# Patient Record
Sex: Female | Born: 1935 | Race: Black or African American | Hispanic: No | Marital: Married | Smoking: Former smoker
Health system: Southern US, Community
[De-identification: ages and names within clinical notes are randomized; demographics above are authoritative.]

## PROBLEM LIST (undated history)

## (undated) DIAGNOSIS — M1990 Unspecified osteoarthritis, unspecified site: Secondary | ICD-10-CM

## (undated) DIAGNOSIS — E559 Vitamin D deficiency, unspecified: Secondary | ICD-10-CM

## (undated) DIAGNOSIS — I2510 Atherosclerotic heart disease of native coronary artery without angina pectoris: Secondary | ICD-10-CM

## (undated) DIAGNOSIS — J309 Allergic rhinitis, unspecified: Secondary | ICD-10-CM

## (undated) DIAGNOSIS — R7309 Other abnormal glucose: Secondary | ICD-10-CM

## (undated) DIAGNOSIS — I10 Essential (primary) hypertension: Secondary | ICD-10-CM

## (undated) DIAGNOSIS — K219 Gastro-esophageal reflux disease without esophagitis: Secondary | ICD-10-CM

## (undated) DIAGNOSIS — M949 Disorder of cartilage, unspecified: Secondary | ICD-10-CM

## (undated) DIAGNOSIS — M899 Disorder of bone, unspecified: Secondary | ICD-10-CM

## (undated) DIAGNOSIS — R42 Dizziness and giddiness: Secondary | ICD-10-CM

## (undated) DIAGNOSIS — D869 Sarcoidosis, unspecified: Secondary | ICD-10-CM

## (undated) HISTORY — DX: Allergic rhinitis, unspecified: J30.9

## (undated) HISTORY — DX: Essential (primary) hypertension: I10

## (undated) HISTORY — DX: Vitamin D deficiency, unspecified: E55.9

## (undated) HISTORY — DX: Disorder of cartilage, unspecified: M94.9

## (undated) HISTORY — DX: Other abnormal glucose: R73.09

## (undated) HISTORY — PX: TOTAL KNEE ARTHROPLASTY: SHX125

## (undated) HISTORY — DX: Dizziness and giddiness: R42

## (undated) HISTORY — PX: KNEE ARTHROSCOPY: SUR90

## (undated) HISTORY — PX: APPENDECTOMY: SHX54

## (undated) HISTORY — DX: Sarcoidosis, unspecified: D86.9

## (undated) HISTORY — DX: Unspecified osteoarthritis, unspecified site: M19.90

## (undated) HISTORY — DX: Gastro-esophageal reflux disease without esophagitis: K21.9

## (undated) HISTORY — DX: Disorder of bone, unspecified: M89.9

---

## 1997-12-24 ENCOUNTER — Encounter: Payer: Self-pay | Admitting: Pulmonary Disease

## 1998-08-24 ENCOUNTER — Encounter: Admission: RE | Admit: 1998-08-24 | Discharge: 1998-08-24 | Payer: Self-pay | Admitting: Internal Medicine

## 1999-01-21 ENCOUNTER — Emergency Department (HOSPITAL_COMMUNITY): Admission: EM | Admit: 1999-01-21 | Discharge: 1999-01-21 | Payer: Self-pay | Admitting: Emergency Medicine

## 1999-03-08 ENCOUNTER — Ambulatory Visit (HOSPITAL_COMMUNITY): Admission: RE | Admit: 1999-03-08 | Discharge: 1999-03-08 | Payer: Self-pay | Admitting: Family Medicine

## 1999-03-14 ENCOUNTER — Encounter: Admission: RE | Admit: 1999-03-14 | Discharge: 1999-03-14 | Payer: Self-pay | Admitting: Hematology and Oncology

## 1999-05-31 ENCOUNTER — Encounter: Admission: RE | Admit: 1999-05-31 | Discharge: 1999-05-31 | Payer: Self-pay | Admitting: Internal Medicine

## 1999-05-31 ENCOUNTER — Encounter: Payer: Self-pay | Admitting: Internal Medicine

## 1999-05-31 ENCOUNTER — Ambulatory Visit (HOSPITAL_COMMUNITY): Admission: RE | Admit: 1999-05-31 | Discharge: 1999-05-31 | Payer: Self-pay | Admitting: Internal Medicine

## 1999-12-02 ENCOUNTER — Encounter: Payer: Self-pay | Admitting: Emergency Medicine

## 1999-12-02 ENCOUNTER — Emergency Department (HOSPITAL_COMMUNITY): Admission: EM | Admit: 1999-12-02 | Discharge: 1999-12-02 | Payer: Self-pay | Admitting: Emergency Medicine

## 2001-01-02 ENCOUNTER — Encounter: Payer: Self-pay | Admitting: Internal Medicine

## 2001-01-02 ENCOUNTER — Ambulatory Visit (HOSPITAL_COMMUNITY): Admission: RE | Admit: 2001-01-02 | Discharge: 2001-01-02 | Payer: Self-pay | Admitting: Internal Medicine

## 2001-01-16 ENCOUNTER — Other Ambulatory Visit: Admission: RE | Admit: 2001-01-16 | Discharge: 2001-01-16 | Payer: Self-pay | Admitting: Internal Medicine

## 2002-02-18 ENCOUNTER — Encounter: Payer: Self-pay | Admitting: Internal Medicine

## 2002-02-18 ENCOUNTER — Ambulatory Visit (HOSPITAL_COMMUNITY): Admission: RE | Admit: 2002-02-18 | Discharge: 2002-02-18 | Payer: Self-pay | Admitting: Internal Medicine

## 2002-04-13 ENCOUNTER — Encounter: Admission: RE | Admit: 2002-04-13 | Discharge: 2002-04-13 | Payer: Self-pay | Admitting: Internal Medicine

## 2002-04-13 ENCOUNTER — Encounter: Payer: Self-pay | Admitting: Internal Medicine

## 2003-01-03 ENCOUNTER — Inpatient Hospital Stay (HOSPITAL_COMMUNITY): Admission: AD | Admit: 2003-01-03 | Discharge: 2003-01-08 | Payer: Self-pay | Admitting: Internal Medicine

## 2003-01-03 ENCOUNTER — Encounter: Payer: Self-pay | Admitting: Internal Medicine

## 2003-01-04 ENCOUNTER — Encounter (INDEPENDENT_AMBULATORY_CARE_PROVIDER_SITE_OTHER): Payer: Self-pay | Admitting: *Deleted

## 2003-08-10 ENCOUNTER — Encounter: Payer: Self-pay | Admitting: Internal Medicine

## 2003-08-10 ENCOUNTER — Ambulatory Visit (HOSPITAL_COMMUNITY): Admission: RE | Admit: 2003-08-10 | Discharge: 2003-08-10 | Payer: Self-pay | Admitting: Internal Medicine

## 2004-06-28 ENCOUNTER — Inpatient Hospital Stay (HOSPITAL_COMMUNITY): Admission: RE | Admit: 2004-06-28 | Discharge: 2004-07-04 | Payer: Self-pay | Admitting: Orthopaedic Surgery

## 2005-02-11 ENCOUNTER — Encounter: Admission: RE | Admit: 2005-02-11 | Discharge: 2005-02-11 | Payer: Self-pay | Admitting: Internal Medicine

## 2005-02-21 ENCOUNTER — Encounter: Admission: RE | Admit: 2005-02-21 | Discharge: 2005-02-21 | Payer: Self-pay | Admitting: Internal Medicine

## 2005-03-14 ENCOUNTER — Inpatient Hospital Stay (HOSPITAL_COMMUNITY): Admission: AD | Admit: 2005-03-14 | Discharge: 2005-03-18 | Payer: Self-pay | Admitting: Orthopaedic Surgery

## 2005-03-14 ENCOUNTER — Encounter (INDEPENDENT_AMBULATORY_CARE_PROVIDER_SITE_OTHER): Payer: Self-pay | Admitting: *Deleted

## 2005-03-14 ENCOUNTER — Ambulatory Visit: Payer: Self-pay | Admitting: Physical Medicine & Rehabilitation

## 2005-06-10 ENCOUNTER — Encounter (INDEPENDENT_AMBULATORY_CARE_PROVIDER_SITE_OTHER): Payer: Self-pay | Admitting: Specialist

## 2005-06-10 ENCOUNTER — Ambulatory Visit (HOSPITAL_COMMUNITY): Admission: RE | Admit: 2005-06-10 | Discharge: 2005-06-10 | Payer: Self-pay | Admitting: Gastroenterology

## 2006-07-31 ENCOUNTER — Encounter: Admission: RE | Admit: 2006-07-31 | Discharge: 2006-07-31 | Payer: Self-pay | Admitting: Internal Medicine

## 2007-09-04 ENCOUNTER — Encounter: Admission: RE | Admit: 2007-09-04 | Discharge: 2007-09-04 | Payer: Self-pay | Admitting: Internal Medicine

## 2007-09-08 ENCOUNTER — Encounter: Admission: RE | Admit: 2007-09-08 | Discharge: 2007-09-08 | Payer: Self-pay | Admitting: Internal Medicine

## 2007-09-15 ENCOUNTER — Encounter: Admission: RE | Admit: 2007-09-15 | Discharge: 2007-09-15 | Payer: Self-pay | Admitting: Internal Medicine

## 2007-09-18 ENCOUNTER — Ambulatory Visit: Payer: Self-pay | Admitting: Pulmonary Disease

## 2007-09-18 LAB — CONVERTED CEMR LAB
Angiotensin 1 Converting Enzyme: 129 units/L — ABNORMAL HIGH (ref 9–67)
Basophils Absolute: 0.1 10*3/uL (ref 0.0–0.1)
Chloride: 104 meq/L (ref 96–112)
Eosinophils Absolute: 0.2 10*3/uL (ref 0.0–0.6)
GFR calc non Af Amer: 76 mL/min
HCT: 40.6 % (ref 36.0–46.0)
MCHC: 34.6 g/dL (ref 30.0–36.0)
MCV: 84.4 fL (ref 78.0–100.0)
Neutrophils Relative %: 79.7 % — ABNORMAL HIGH (ref 43.0–77.0)
Potassium: 4.4 meq/L (ref 3.5–5.1)
RBC: 4.81 M/uL (ref 3.87–5.11)
Sodium: 140 meq/L (ref 135–145)

## 2007-10-07 ENCOUNTER — Ambulatory Visit: Payer: Self-pay | Admitting: Pulmonary Disease

## 2007-10-07 DIAGNOSIS — D869 Sarcoidosis, unspecified: Secondary | ICD-10-CM | POA: Insufficient documentation

## 2007-10-07 DIAGNOSIS — J309 Allergic rhinitis, unspecified: Secondary | ICD-10-CM | POA: Insufficient documentation

## 2007-10-07 DIAGNOSIS — I10 Essential (primary) hypertension: Secondary | ICD-10-CM | POA: Insufficient documentation

## 2007-12-18 ENCOUNTER — Telehealth (INDEPENDENT_AMBULATORY_CARE_PROVIDER_SITE_OTHER): Payer: Self-pay | Admitting: *Deleted

## 2007-12-18 ENCOUNTER — Ambulatory Visit: Payer: Self-pay | Admitting: Pulmonary Disease

## 2008-01-22 ENCOUNTER — Ambulatory Visit: Payer: Self-pay | Admitting: Internal Medicine

## 2008-01-22 DIAGNOSIS — K219 Gastro-esophageal reflux disease without esophagitis: Secondary | ICD-10-CM | POA: Insufficient documentation

## 2008-01-27 ENCOUNTER — Ambulatory Visit: Payer: Self-pay | Admitting: Internal Medicine

## 2008-01-27 DIAGNOSIS — R0602 Shortness of breath: Secondary | ICD-10-CM | POA: Insufficient documentation

## 2008-01-31 ENCOUNTER — Encounter: Payer: Self-pay | Admitting: Internal Medicine

## 2008-03-18 ENCOUNTER — Ambulatory Visit: Payer: Self-pay | Admitting: Pulmonary Disease

## 2008-05-25 ENCOUNTER — Ambulatory Visit: Payer: Self-pay | Admitting: Internal Medicine

## 2008-05-25 DIAGNOSIS — R7309 Other abnormal glucose: Secondary | ICD-10-CM

## 2008-06-15 ENCOUNTER — Ambulatory Visit: Payer: Self-pay | Admitting: Pulmonary Disease

## 2008-06-15 ENCOUNTER — Ambulatory Visit: Payer: Self-pay | Admitting: Internal Medicine

## 2008-06-15 LAB — CONVERTED CEMR LAB
BUN: 8 mg/dL (ref 6–23)
Calcium: 9 mg/dL (ref 8.4–10.5)
GFR calc Af Amer: 91 mL/min
GFR calc non Af Amer: 76 mL/min
Potassium: 4 meq/L (ref 3.5–5.1)
Sodium: 141 meq/L (ref 135–145)

## 2008-06-24 ENCOUNTER — Ambulatory Visit: Payer: Self-pay | Admitting: Internal Medicine

## 2008-06-24 DIAGNOSIS — M949 Disorder of cartilage, unspecified: Secondary | ICD-10-CM

## 2008-06-24 DIAGNOSIS — M899 Disorder of bone, unspecified: Secondary | ICD-10-CM | POA: Insufficient documentation

## 2008-08-09 ENCOUNTER — Encounter: Payer: Self-pay | Admitting: Internal Medicine

## 2008-09-09 ENCOUNTER — Encounter: Admission: RE | Admit: 2008-09-09 | Discharge: 2008-09-09 | Payer: Self-pay | Admitting: Internal Medicine

## 2008-09-22 ENCOUNTER — Ambulatory Visit: Payer: Self-pay | Admitting: Internal Medicine

## 2008-09-22 LAB — CONVERTED CEMR LAB: Vit D, 1,25-Dihydroxy: 14 — ABNORMAL LOW (ref 30–89)

## 2008-09-30 ENCOUNTER — Ambulatory Visit: Payer: Self-pay | Admitting: Internal Medicine

## 2008-09-30 DIAGNOSIS — E559 Vitamin D deficiency, unspecified: Secondary | ICD-10-CM | POA: Insufficient documentation

## 2008-09-30 LAB — CONVERTED CEMR LAB
BUN: 13 mg/dL (ref 6–23)
Chloride: 103 meq/L (ref 96–112)
Creatinine, Ser: 0.8 mg/dL (ref 0.4–1.2)
GFR calc non Af Amer: 76 mL/min
Glucose, Bld: 88 mg/dL (ref 70–99)

## 2008-10-03 ENCOUNTER — Encounter: Payer: Self-pay | Admitting: Internal Medicine

## 2008-10-07 ENCOUNTER — Ambulatory Visit: Payer: Self-pay | Admitting: Pulmonary Disease

## 2008-11-04 ENCOUNTER — Ambulatory Visit: Payer: Self-pay | Admitting: Pulmonary Disease

## 2008-11-04 ENCOUNTER — Encounter: Payer: Self-pay | Admitting: Pulmonary Disease

## 2008-11-04 LAB — CONVERTED CEMR LAB
ALT: 12 units/L (ref 0–35)
AST: 14 units/L (ref 0–37)
Albumin: 3.7 g/dL (ref 3.5–5.2)
Alkaline Phosphatase: 40 units/L (ref 39–117)
Bilirubin, Direct: 0.3 mg/dL (ref 0.0–0.3)
Total Protein: 8.1 g/dL (ref 6.0–8.3)

## 2008-12-21 ENCOUNTER — Encounter: Payer: Self-pay | Admitting: Pulmonary Disease

## 2008-12-27 ENCOUNTER — Encounter (HOSPITAL_COMMUNITY): Admission: RE | Admit: 2008-12-27 | Discharge: 2009-03-27 | Payer: Self-pay | Admitting: Orthopedic Surgery

## 2009-01-04 ENCOUNTER — Ambulatory Visit: Payer: Self-pay | Admitting: Internal Medicine

## 2009-01-04 DIAGNOSIS — R269 Unspecified abnormalities of gait and mobility: Secondary | ICD-10-CM

## 2009-01-04 LAB — CONVERTED CEMR LAB
ALT: 10 units/L (ref 0–35)
Alkaline Phosphatase: 39 units/L (ref 39–117)
Basophils Absolute: 0 10*3/uL (ref 0.0–0.1)
Bilirubin, Direct: 0.1 mg/dL (ref 0.0–0.3)
CO2: 31 meq/L (ref 19–32)
Calcium: 9.2 mg/dL (ref 8.4–10.5)
Chloride: 109 meq/L (ref 96–112)
Glucose, Bld: 90 mg/dL (ref 70–99)
Hemoglobin: 13.7 g/dL (ref 12.0–15.0)
Lymphocytes Relative: 28.4 % (ref 12.0–46.0)
MCHC: 34.5 g/dL (ref 30.0–36.0)
Monocytes Relative: 9.1 % (ref 3.0–12.0)
Neutrophils Relative %: 58.4 % (ref 43.0–77.0)
Platelets: 148 10*3/uL — ABNORMAL LOW (ref 150–400)
Potassium: 4.9 meq/L (ref 3.5–5.1)
RDW: 15.1 % — ABNORMAL HIGH (ref 11.5–14.6)
Sodium: 141 meq/L (ref 135–145)
TSH: 1.24 microintl units/mL (ref 0.35–5.50)
Total Bilirubin: 1.5 mg/dL — ABNORMAL HIGH (ref 0.3–1.2)
Total Protein: 7.8 g/dL (ref 6.0–8.3)

## 2009-01-05 ENCOUNTER — Encounter: Payer: Self-pay | Admitting: Internal Medicine

## 2009-01-05 ENCOUNTER — Telehealth: Payer: Self-pay | Admitting: Internal Medicine

## 2009-01-06 ENCOUNTER — Telehealth: Payer: Self-pay | Admitting: Internal Medicine

## 2009-01-23 ENCOUNTER — Ambulatory Visit: Payer: Self-pay | Admitting: Pulmonary Disease

## 2009-03-27 ENCOUNTER — Encounter: Payer: Self-pay | Admitting: Pulmonary Disease

## 2009-05-03 ENCOUNTER — Ambulatory Visit: Payer: Self-pay | Admitting: Internal Medicine

## 2009-08-23 ENCOUNTER — Ambulatory Visit (HOSPITAL_BASED_OUTPATIENT_CLINIC_OR_DEPARTMENT_OTHER): Admission: RE | Admit: 2009-08-23 | Discharge: 2009-08-23 | Payer: Self-pay | Admitting: Internal Medicine

## 2009-08-23 ENCOUNTER — Ambulatory Visit: Payer: Self-pay | Admitting: Radiology

## 2009-08-23 ENCOUNTER — Ambulatory Visit: Payer: Self-pay | Admitting: Internal Medicine

## 2009-08-23 DIAGNOSIS — R05 Cough: Secondary | ICD-10-CM

## 2009-08-23 DIAGNOSIS — R059 Cough, unspecified: Secondary | ICD-10-CM | POA: Insufficient documentation

## 2009-09-04 ENCOUNTER — Telehealth: Payer: Self-pay | Admitting: Internal Medicine

## 2009-09-14 ENCOUNTER — Encounter: Admission: RE | Admit: 2009-09-14 | Discharge: 2009-09-14 | Payer: Self-pay | Admitting: Internal Medicine

## 2009-09-19 ENCOUNTER — Ambulatory Visit: Payer: Self-pay | Admitting: Pulmonary Disease

## 2009-10-23 ENCOUNTER — Ambulatory Visit: Payer: Self-pay | Admitting: Internal Medicine

## 2009-10-23 DIAGNOSIS — R197 Diarrhea, unspecified: Secondary | ICD-10-CM

## 2009-10-23 LAB — CONVERTED CEMR LAB
ALT: 19 units/L (ref 0–35)
AST: 28 units/L (ref 0–37)
Albumin: 4.2 g/dL (ref 3.5–5.2)
BUN: 11 mg/dL (ref 6–23)
Basophils Absolute: 0.1 10*3/uL (ref 0.0–0.1)
Chloride: 100 meq/L (ref 96–112)
Cortisol, Plasma: 18.6 ug/dL
Creatinine, Ser: 0.81 mg/dL (ref 0.40–1.20)
Eosinophils Absolute: 0.4 10*3/uL (ref 0.0–0.7)
Eosinophils Relative: 8 % — ABNORMAL HIGH (ref 0–5)
Glucose, Bld: 96 mg/dL (ref 70–99)
HCT: 44.4 % (ref 36.0–46.0)
Hemoglobin: 14.3 g/dL (ref 12.0–15.0)
Lymphocytes Relative: 16 % (ref 12–46)
Lymphs Abs: 0.8 10*3/uL (ref 0.7–4.0)
MCV: 89.5 fL (ref 78.0–100.0)
Monocytes Absolute: 0.3 10*3/uL (ref 0.1–1.0)
Platelets: 222 10*3/uL (ref 150–400)
Potassium: 3.8 meq/L (ref 3.5–5.3)
RDW: 14.3 % (ref 11.5–15.5)
Total Protein: 7.7 g/dL (ref 6.0–8.3)

## 2009-11-01 ENCOUNTER — Telehealth: Payer: Self-pay | Admitting: Internal Medicine

## 2009-12-21 ENCOUNTER — Ambulatory Visit: Payer: Self-pay | Admitting: Internal Medicine

## 2010-03-23 ENCOUNTER — Ambulatory Visit: Payer: Self-pay | Admitting: Pulmonary Disease

## 2010-08-24 ENCOUNTER — Telehealth: Payer: Self-pay | Admitting: Pulmonary Disease

## 2010-08-24 ENCOUNTER — Ambulatory Visit: Payer: Self-pay | Admitting: Pulmonary Disease

## 2010-08-29 ENCOUNTER — Encounter: Payer: Self-pay | Admitting: Pulmonary Disease

## 2010-10-12 ENCOUNTER — Encounter: Admission: RE | Admit: 2010-10-12 | Discharge: 2010-10-12 | Payer: Self-pay | Admitting: Internal Medicine

## 2010-10-18 ENCOUNTER — Encounter: Payer: Self-pay | Admitting: Internal Medicine

## 2010-10-24 ENCOUNTER — Encounter: Admission: RE | Admit: 2010-10-24 | Discharge: 2010-10-24 | Payer: Self-pay | Admitting: Internal Medicine

## 2011-01-13 LAB — CONVERTED CEMR LAB
Basophils Relative: 0.6 % (ref 0.0–1.0)
Bilirubin, Direct: 0.2 mg/dL (ref 0.0–0.3)
CO2: 34 meq/L — ABNORMAL HIGH (ref 19–32)
Eosinophils Relative: 3.3 % (ref 0.0–5.0)
GFR calc Af Amer: 80 mL/min
Glucose, Bld: 126 mg/dL — ABNORMAL HIGH (ref 70–99)
HCT: 42 % (ref 36.0–46.0)
HDL: 57.7 mg/dL (ref >39.0)
Hemoglobin: 14.3 g/dL (ref 12.0–15.0)
Lymphocytes Relative: 27.8 % (ref 12.0–46.0)
Monocytes Absolute: 0.3 10*3/uL (ref 0.2–0.7)
Neutro Abs: 3.5 10*3/uL (ref 1.4–7.7)
Neutrophils Relative %: 62.1 % (ref 43.0–77.0)
Potassium: 4.4 meq/L (ref 3.5–5.1)
Total Bilirubin: 0.8 mg/dL (ref 0.3–1.2)
Total Protein: 7.7 g/dL (ref 6.0–8.3)
VLDL: 11 mg/dL (ref 0–40)
WBC: 5.5 10*3/uL (ref 4.5–10.5)

## 2011-01-17 NOTE — Progress Notes (Signed)
Summary: letter request -   Phone Note Call from Patient Call back at Home Phone [phone]   Caller: Patient Call For: alva Summary of Call: pt requests a brief letter w/ diagnosis (for the postal service). [place] and this will allow her to get a mailbox closer to her house. mail to pt's home address.  Initial call taken by: Tivis Ringer, CNA,  August 24, 2010 4:01 PM  Follow-up for Phone Call        ATC above number -- after ringing several times received a few short beeps followed by one long beep -- [facility] Gweneth Dimitri RN  August 24, 2010 4:23 PM  Spoke with pt.  She states [place] and her mailbox is across the street and sometimes she feels like she can not get across the street fast enough and is afraid she will be hit by a car.  She states that the postal service worker advised her that if she got a letter from her doctor that they could provide her with a box closer to her home so she does not have to cross the bust road.  Would like this mailed to her once done.  Pls advise thanks Follow-up by: Vernie Murders,  August 27, 2010 4:08 PM  Additional Follow-up for Phone Call Additional follow up Details #1::        OK to provide - 'being treated for sarcoidosis & cannot walk long distances....... Pl help in whatever manner possible.' Additional Follow-up by: Comer Locket. Vassie Loll MD,  August 28, 2010 9:14 AM    Additional Follow-up for Phone Call Additional follow up Details #2::    Letter composed and pending RA's signature. Placed in "look at". Zackery Barefoot CMA  August 29, 2010 2:31 PM

## 2011-01-17 NOTE — Letter (Signed)
Summary: [facility]  [address]   [place], [place] [address]   Phone: [phone]  Fax: [phone]    08/29/2010  [name] [address] [place], [place]  [address]   TO WHOM IT MAY CONCERN,  The above named patient is being treated for sarcoidosis and cannot walk long distances. Please help in whatever manner possible.          Sincerely,    Cyril Mourning, MD

## 2011-01-17 NOTE — Miscellaneous (Signed)
Summary: Orders Update pft charges  Clinical Lists Changes  Orders: Added new Service order of Carbon Monoxide diffusing w/capacity (94720) - Signed Added new Service order of Lung Volumes (94240) - Signed Added new Service order of Spirometry (Pre & Post) (94060) - Signed 

## 2011-01-17 NOTE — Assessment & Plan Note (Signed)
Summary: [name] ///KP   Primary Provider/Referring Provider:  Dondra Spry DO  CC:  Follow-up visit patient states she has good days and bad days, and advised by Dr. Artist Pais to come in to see Dr. Vassie Loll, and .  History of Present Illness: 75 year old with recurrence of sarcoidosis involving her skin and lungs.  Good response to 10 mg of prednisone , her dyspnea  improved, and her skin lesions  resolved. Chest x-ray showed worsening of bilateral parenchymal scarring with chronic fibrotic changes.  ACE level was 129.    10/09 dyspnea persists, desatn on exertion, FVC 52 %, TLC 57%, DLCO 32 % >> decreased wt gain & hyperglycemia with steroids discussed, Spirometry 10/09  >> FVC decreased to 52% from 63% in 4/09, DLCO 32%. Started methotrexate 5 mg q sunday with LFT monitoring >> other side effects discussed  September 19, 2009 10:08 AM  stopped pulm rehab x 2 months due to knee swelling. Breathing better in cold weather. Stopped methotrexate - drug store would not fill, back on prednisone FVC 57%, DLCO 38% - slightly better  March 23, 2010 4:33 PM  Methotrexate was stopped in 11/10 due to diarrhea x 2 weeks, then resumed in 1/11 - 3/11, of agin since Rx ran out. Her skin lesions have returned  per , daughter she is sob on minimal exertion.  desatn today on less than a lap. CXr - stable stg 3 disease.  Allergies: No Known Drug Allergies  Past History:  Past Medical History: Last updated: 12/21/2009 SARCOIDOSIS (ICD-135) HYPERTENSION (ICD-401.9) ALLERGIC RHINITIS (ICD-477.9) GERD  ABNORMAL GLUCOSE   VERTIGO     Social History: Last updated: 12/21/2009 Married - lives with her husband.  One daughter Retired Former Smoker quit 25 yrs ago - smoked 1/2 ppd for 10 years. Alcohol use-no          Review of Systems       The patient complains of dyspnea on exertion and prolonged cough.  The patient denies anorexia, fever, weight loss, weight gain, vision loss, decreased hearing, hoarseness,  chest pain, syncope, peripheral edema, headaches, hemoptysis, abdominal pain, melena, hematochezia, severe indigestion/heartburn, hematuria, muscle weakness, suspicious skin lesions, transient blindness, difficulty walking, depression, unusual weight change, and abnormal bleeding.    Vital Signs:  Patient profile:   75 year old female Height:      67 inches Weight:      221.31 pounds O2 Sat:      93 % on Room air Temp:     98 .3 degrees F oral Pulse rate:   91 / minute BP sitting:   132 / 84  (right arm) Cuff size:   regular  Vitals Entered By: Carver Fila SMA (March 23, 2010 4:18 PM)  O2 Flow:  Room air  Serial Vital Signs/Assessments:  Comments: Ambulatory Pulse Oximetry  Resting; HR_86____    02 Sat___91%RA__  Lap1 (185 feet)   HR_____   02 Sat_____ Lap2 (185 feet)   HR_____   02 Sat_____    Lap3 (185 feet)   HR_____   02 Sat_____  ___Test Completed without Difficulty __X_Test Stopped due to: Pt desat to 82%RA 3/4 of 1st lap. Pt recovered to 92%RA after resting for 6 minutes. Carver Fila SMA  March 23, 2010 5:18 PM     By: Zackery Barefoot CMA   CC: Follow-up visit patient states she has good days and bad days, and advised by Dr. Artist Pais to come in to see Dr. Vassie Loll, Comments Medications  reviewed with patient Verified contact number and pharmacy with patient Carver Fila SMA  March 23, 2010 4:19 PM    Physical Exam  Additional Exam:  Gen. Pleasant, well-nourished, in no distress ENT - no lesions, no post nasal drip Neck: No JVD, no thyromegaly, no carotid bruits Lungs: no use of accessory muscles, no dullness to percussion, bibasal 1/3 rales, no rhonchi  Cardiovascular: Rhythm regular, heart sounds  normal, no murmurs or gallops, no peripheral edema Musculoskeletal: No deformities, no cyanosis or clubbing      CXR  Procedure date:  03/23/2010  Findings:      IMPRESSION:   1.  Chronic lung disease consistent to a sarcoid. 2.  Stable bilateral  hilar  adenopathy 3.  Cardiac enlargement without heart failure.  Impression & Recommendations:  Problem # 1:  SARCOIDOSIS (ICD-135) Improvement in lung function & symptoms have suggested some revrsible sarcoidosis with steroids. However limited by wt gain & hyperglycemia. Hence , better to resume & increase methotrexate 5 mg q week . She will call us if diarrhea returns - not convinced that this was drug related.  Medications Added to Medication List This Visit: 1)  Methotrexate 2.5 Mg Tabs (Methotrexate sodium) .... 2 tabs every sunday  Other Orders: T-2 View CXR ([id number]) Est. Patient Level III (99213) Prescription Created Electronically (G8553) Pulse Oximetry, Ambulatory (94761)  Patient Instructions: 1)  Copy sent to: Dr Yoo 2)  Please schedule a follow-up appointment in 3 months with TP 3)  Get back on methotrexate 4)  A chest x-ray has been recommended.  Your imaging study may require preauthorization.  Prescriptions: METHOTREXATE 2.5 MG TABS (METHOTREXATE SODIUM) 2 tabs every sunday  #24 x 1   Entered and Authorized by:    V.  MD   Signed by:    V.  MD on 03/23/2010   Method used:   Electronically to        [facility] [address] #[id number]* (retail)       [address]       [place], [place]  [address]       Ph: [phone] or [phone]       Fax: [phone]   RxID:   [id number]      Appended Document: ROV ///KP PFTs 08/24/10 >> unchanged lung volumes, DLCO 30% - unchanged

## 2011-01-17 NOTE — Assessment & Plan Note (Signed)
Summary: f/u/hea   Vital Signs:  Patient profile:   75 year old female Weight:      214.25 pounds BMI:     35.78 O2 Sat:      95 % on Room air Temp:     98.2 degrees F oral Pulse rate:   89 / minute Pulse rhythm:   regular Resp:     18 per minute BP sitting:   108 / 80  (right arm) Cuff size:   large  Vitals Entered By: Glendell Docker CMA (December 21, 2009 2:25 PM)  O2 Flow:  Room air  Primary Care Provider:  D. Thomos Lemons DO  CC:  Folllow up .  History of Present Illness: Follow up - no concerns  75 y/o AA female with hx of sarcoidosis for f/u.   Prev she was seen for diarrhea and weakness.   her symptoms resolved after stopping MTX and restarted prednisone "this is the best I have felt in years" no new skin lesions.  Htn - stable.  Lab results reviewed.  Allergies (verified): No Known Drug Allergies  Past History:  Past Medical History: SARCOIDOSIS (ICD-135) HYPERTENSION (ICD-401.9) ALLERGIC RHINITIS (ICD-477.9) GERD  ABNORMAL GLUCOSE   VERTIGO     Past Surgical History: Appendectomy  Knee Arthroscopy       Social History: Married - lives with her husband.  One daughter Retired Former Smoker quit 25 yrs ago - smoked 1/2 ppd for 10 years. Alcohol use-no          Physical Exam  General:  alert and overweight-appearing.  pleasant Lungs:  normal respiratory effort.  bilateral crackles Heart:  normal rate, regular rhythm, and no gallop.   Extremities:  No lower extremity edema    Impression & Recommendations:  Problem # 1:  SARCOIDOSIS (ICD-135) Assessment Improved She is feeling much better.  Resp status stable.  no new skin lesions.   I would like to taper off prednisone and re try MTX.  Pred 5 mg once daily x 1 month.   Arrange f/u with Dr. Vassie Loll in 2 months.  Problem # 2:  HYPERTENSION (ICD-401.9) well controlled.  Maintain current medication regimen.  The following medications were removed from the medication list:    Lasix 20 Mg Tabs  (Furosemide) .Marland Kitchen... 1/2 by mouth once daily as needed Her updated medication list for this problem includes:    Benicar Hct 40-25 Mg Tabs (Olmesartan medoxomil-hctz) ..... One by mouth qd  BP today: 108/80 Prior BP: 114/80 (10/23/2009)  Labs Reviewed: K+: 3.8 (10/23/2009) Creat: : 0.81 (10/23/2009)   Chol: 186 (01/27/2008)   HDL: 57.7 (01/27/2008)   LDL: 118 (01/27/2008)   TG: 54 (01/27/2008)  Problem # 3:  DIARRHEA (ICD-787.91) Assessment: Improved resolved.  AM cortisol was normal.  Complete Medication List: 1)  Benicar Hct 40-25 Mg Tabs (Olmesartan medoxomil-hctz) .... One by mouth qd 2)  Centrum Silver Tabs (Multiple vitamins-minerals) .... Take 1 tablet by mouth once a day 3)  Vitamin D 2000 Unit Tabs (Cholecalciferol) .... Take 1 tablet by mouth once a day 4)  Prednisone 5 Mg Tabs (Prednisone) .... One by mouth once daily 5)  Methotrexate 2.5 Mg Tabs (Methotrexate sodium) .... 2 tabs on sunday  Patient Instructions: 1)  Please schedule a follow-up appointment in 2 months with Dr. Vassie Loll Prescriptions: METHOTREXATE 2.5 MG TABS (METHOTREXATE SODIUM) 2 tabs on Sunday  #8 x 2   Entered and Authorized by:   D. Thomos Lemons DO   Signed by:  Dondra Spry DO on 12/21/2009   Method used:   Electronically to        [facility]. [address] [phone]* (retail)       [address]       [place], [place]  [address]       Ph: [phone] or [phone]       Fax: [phone]   RxID:   [id number] PREDNISONE 5 MG TABS (PREDNISONE) one by mouth once daily  #30 x 0   Entered and Authorized by:   D. Thomos Lemons DO   Signed by:   D. Thomos Lemons DO on 12/21/2009   Method used:   Electronically to        [facility]. [address] [phone]* (retail)       [address]       [place], [place]  [address]       Ph: [phone] or [phone]       Fax: [phone]   RxID:   [id number]   Current Allergies (reviewed today): No known allergies

## 2011-02-13 ENCOUNTER — Encounter: Payer: Self-pay | Admitting: Internal Medicine

## 2011-02-13 ENCOUNTER — Other Ambulatory Visit: Payer: Self-pay | Admitting: Internal Medicine

## 2011-02-13 ENCOUNTER — Ambulatory Visit (INDEPENDENT_AMBULATORY_CARE_PROVIDER_SITE_OTHER): Payer: Medicare Other | Admitting: Internal Medicine

## 2011-02-13 ENCOUNTER — Ambulatory Visit (HOSPITAL_BASED_OUTPATIENT_CLINIC_OR_DEPARTMENT_OTHER)
Admission: RE | Admit: 2011-02-13 | Discharge: 2011-02-13 | Disposition: A | Discharge status: Home / Self Care | Payer: Medicare Other | Source: Ambulatory Visit | Attending: Internal Medicine | Admitting: Internal Medicine

## 2011-02-13 DIAGNOSIS — R05 Cough: Secondary | ICD-10-CM

## 2011-02-13 DIAGNOSIS — D869 Sarcoidosis, unspecified: Secondary | ICD-10-CM

## 2011-02-13 DIAGNOSIS — R053 Chronic cough: Secondary | ICD-10-CM

## 2011-02-13 DIAGNOSIS — I10 Essential (primary) hypertension: Secondary | ICD-10-CM

## 2011-02-13 DIAGNOSIS — R059 Cough, unspecified: Secondary | ICD-10-CM

## 2011-02-13 DIAGNOSIS — R0602 Shortness of breath: Secondary | ICD-10-CM | POA: Insufficient documentation

## 2011-02-13 LAB — CONVERTED CEMR LAB
ALT: 9 units/L (ref 0–35)
AST: 16 units/L (ref 0–37)
Albumin: 4.4 g/dL (ref 3.5–5.2)
Alkaline Phosphatase: 48 units/L (ref 39–117)
BUN: 15 mg/dL (ref 6–23)
Bilirubin, Direct: 0.2 mg/dL (ref 0.0–0.3)
CO2: 29 meq/L (ref 19–32)
Calcium: 9.8 mg/dL (ref 8.4–10.5)
Chloride: 100 meq/L (ref 96–112)
Cholesterol: 172 mg/dL (ref 0–200)
Creatinine, Ser: 0.74 mg/dL (ref 0.40–1.20)
Glucose, Bld: 84 mg/dL (ref 70–99)
HCT: 44.3 % (ref 36.0–46.0)
HDL: 51 mg/dL (ref >39)
Hemoglobin: 14.4 g/dL (ref 12.0–15.0)
Hgb A1c MFr Bld: 6.1 % — ABNORMAL HIGH (ref <5.7)
Indirect Bilirubin: 0.9 mg/dL (ref 0.0–0.9)
LDL Cholesterol: 106 mg/dL — ABNORMAL HIGH (ref 0–99)
MCHC: 32.5 g/dL (ref 30.0–36.0)
MCV: 89.5 fL (ref 78.0–100.0)
Platelets: 121 10*3/uL — ABNORMAL LOW (ref 150–400)
Potassium: 4.1 meq/L (ref 3.5–5.3)
RBC: 4.95 M/uL (ref 3.87–5.11)
RDW: 14.5 % (ref 11.5–15.5)
Sodium: 140 meq/L (ref 135–145)
TSH: 1.323 microintl units/mL (ref 0.350–4.500)
Total Bilirubin: 1.1 mg/dL (ref 0.3–1.2)
Total CHOL/HDL Ratio: 3.4
Total Protein: 8.4 g/dL — ABNORMAL HIGH (ref 6.0–8.3)
Triglycerides: 73 mg/dL (ref <150)
VLDL: 15 mg/dL (ref 0–40)
WBC: 4.6 10*3/uL (ref 4.0–10.5)

## 2011-02-14 ENCOUNTER — Encounter: Payer: Self-pay | Admitting: Internal Medicine

## 2011-02-14 ENCOUNTER — Telehealth: Payer: Self-pay | Admitting: Internal Medicine

## 2011-02-21 NOTE — Letter (Signed)
   [place] [facility] [address] [place], [place]  [address]  [place] Phone: [phone]      February 14, 2011   [facility] [place] [address] [facility] [place], [place] [address]  RE:  LAB RESULTS  Dear  Ms. Stamps,  The following is an interpretation of your most recent lab tests.  Please take note of any instructions provided or changes to medications that have resulted from your lab work.  ELECTROLYTES:  Good - no changes needed  KIDNEY FUNCTION TESTS:  Good - no changes needed  LIVER FUNCTION TESTS:  Stable - no changes needed  LIPID PANEL:  Good - no changes needed Triglyceride: 73   Cholesterol: 172   LDL: 106   HDL: 51   Chol/HDL%:  3.4 Ratio  THYROID STUDIES:  Thyroid studies normal TSH: 1.323     DIABETIC STUDIES:  Good - no changes needed Blood Glucose: 84   HgbA1C: 6.1     CBC:  Good - no changes needed       Sincerely Yours,    Dr. Thomos Lemons  Appended Document:  mailed

## 2011-02-26 NOTE — Progress Notes (Signed)
Summary: CXR results  Phone Note Outgoing Call   Summary of Call: call pt - chest xray is stable.  no acute changes.  I will send letter re:  lab results  Initial call taken by: D. Thomos Lemons DO,  February 14, 2011 4:42 PM  Follow-up for Phone Call        call placed to patient at [phone], no answer, no voice mail. Glendell Docker CMA  February 15, 2011 11:39 AM   call place to patient a [phone], no answer, no voice mail. Glendell Docker CMA  February 15, 2011 3:47 PM   Additional Follow-up for Phone Call Additional follow up Details #1::        Attempted to contact pt; no answer, no voicemail. Unable to leave message. Nicki Guadalajara Fergerson CMA Duncan Dull)  February 18, 2011 3:51 PM

## 2011-03-05 NOTE — Assessment & Plan Note (Signed)
Summary: 1 year follow up/flu shot/ss   Vital Signs:  Patient profile:   75 year old female Height:      67 inches Weight:      219.50 pounds BMI:     34.50 O2 Sat:      93 % Temp:     97.5 degrees F oral Pulse rate:   72 / minute Resp:     18 per minute BP sitting:   140 / 90  (left arm) Cuff size:   large  Vitals Entered By: Glendell Docker CMA (February 13, 2011 10:21 AM) CC: follow-up visit   Primary Care Provider:  Dondra Spry DO  CC:  follow-up visit.  History of Present Illness:   Hypertension Follow-Up      This is a 75 year old woman who presents for Hypertension follow-up.  The patient denies lightheadedness and headaches.  The patient denies the following associated symptoms: chest pain.  The patient reports that dietary compliance has been fair.    Sarcoidosis - never followed up with Dr. Vassie Loll stopped MTX.  pt felt it did not help much pt notes chronic cough.    Preventive Screening-Counseling & Management  Alcohol-Tobacco     Smoking Status: quit  Allergies (verified): No Known Drug Allergies  Past History:  Past Medical History: SARCOIDOSIS (ICD-135) HYPERTENSION (ICD-401.9) ALLERGIC RHINITIS (ICD-477.9) GERD  ABNORMAL GLUCOSE   VERTIGO      Past Surgical History: Appendectomy  Knee Arthroscopy        Family History: Sister with asthma Mother has history of bone cancer Sister with DM Type II     husband had health problems - had AAA repair    Social History: Married - lives with her husband.  One daughter Retired Former Smoker quit 25 yrs ago - smoked 1/2 ppd for 10 years. Alcohol use-no         Smoking Status:  quit  Physical Exam  General:  alert, well-developed, and well-nourished.   Mouth:  pharynx pink and moist.   Lungs:  normal respiratory effort.  bilateral crackles Heart:  normal rate, regular rhythm, and no gallop.     Impression & Recommendations:  Problem # 1:  HYPERTENSION (ICD-401.9) Assessment  Unchanged  Her updated medication list for this problem includes:    Benicar Hct 40-12.5 Mg Tabs (Olmesartan medoxomil-hctz) ..... One by mouth once daily  Orders: T-Basic Metabolic Panel [id number]) T-Hepatic Function [id number]) T-Lipid Profile [id number]) T-TSH ([id number])  BP today: 140/90 Prior BP: 132/84 (03/23/2010)  Labs Reviewed: K+: 3.8 (10/23/2009) Creat: : 0.81 (10/23/2009)   Chol: 186 (01/27/2008)   HDL: 57.7 (01/27/2008)   LDL: 118 (01/27/2008)   TG: 54 (01/27/2008)  Problem # 2:  SARCOIDOSIS (ICD-135) Pt notes increase in cough and dyspnea restart prednisone pt feels MTX does not help arrange pulm follow up  Orders: T-CBC No Diff ([id number]) Pulmonary Referral (Pulmonary)  Complete Medication List: 1)  Benicar Hct 40-12.5 Mg Tabs (Olmesartan medoxomil-hctz) .... One by mouth once daily 2)  Centrum Silver Tabs (Multiple vitamins-minerals) .... Take 1 tablet by mouth once a day 3)  Vitamin D 2000 Unit Tabs (Cholecalciferol) .... Take 1 tablet by mouth once a day 4)  Prednisone 10 Mg Tabs (Prednisone) .... One by mouth once daily  Other Orders: T- Hemoglobin A1C ([id number]) CXR- 2view (CXR)  Patient Instructions: 1)  Please schedule a follow-up appointment in 6 months. Prescriptions: PREDNISONE 10 MG TABS (PREDNISONE) one by mouth once daily  #30 x 2  Entered and Authorized by:   D. Thomos Lemons DO   Signed by:   D. Thomos Lemons DO on 02/13/2011   Method used:   Electronically to        [facility]. [address] [phone]* (retail)       [address]       [place], [place]  [address]       Ph: [phone] or [phone]       Fax: [phone]   RxID:   [id number] BENICAR HCT 40-12.5 MG TABS (OLMESARTAN MEDOXOMIL-HCTZ) one by mouth once daily  #90 x 1   Entered and Authorized by:   D. Thomos Lemons DO   Signed by:   D. Thomos Lemons DO on 02/13/2011   Method used:   Electronically to        [facility]. [address] [phone]* (retail)        [address]       [place], [place]  [address]       Ph: [phone] or [phone]       Fax: [phone]   RxID:   [id number]    Orders Added: 1)  T-Basic Metabolic Panel [id number] 2)  T-Hepatic Function [80076-22960] 3)  T-Lipid Profile [80061-22930] 4)  T- Hemoglobin A1C [83036-23375] 5)  T-TSH [03500-93818] 6)  T-CBC No Diff [85027-10000] 7)  CXR- 2view [CXR] 8)  Pulmonary Referral [Pulmonary] 9)  Est. Patient Level III [29937]    Current Allergies (reviewed today): No known allergies

## 2011-03-19 ENCOUNTER — Institutional Professional Consult (permissible substitution): Payer: Medicare Other | Admitting: Pulmonary Disease

## 2011-04-10 ENCOUNTER — Encounter: Payer: Self-pay | Admitting: Pulmonary Disease

## 2011-04-12 ENCOUNTER — Ambulatory Visit (INDEPENDENT_AMBULATORY_CARE_PROVIDER_SITE_OTHER): Payer: Medicare Other | Admitting: Pulmonary Disease

## 2011-04-12 ENCOUNTER — Encounter: Payer: Self-pay | Admitting: Pulmonary Disease

## 2011-04-12 VITALS — BP 132/80 | HR 90 | Temp 98.4°F | Ht 67.0 in | Wt 226.4 lb

## 2011-04-12 DIAGNOSIS — D869 Sarcoidosis, unspecified: Secondary | ICD-10-CM

## 2011-04-12 MED ORDER — METHOTREXATE 2.5 MG PO TABS: 10.0000 mg | ORAL_TABLET | ORAL | Status: AC

## 2011-04-12 NOTE — Progress Notes (Signed)
SATURATION QUALIFICATIONS:  Patient Saturations on Room Air at Rest = 91%  Patient Saturations on Room Air while Ambulating = 81%  Patient Saturations on 2 Liters of oxygen while Ambulating = 95%

## 2011-04-12 NOTE — Patient Instructions (Signed)
We will set you up with oxygen  Use this when you walk & during sleep. Start methotrexate 4 tabs every Saturday - DO NOT stop this until we say so. Take 1/2 tab of prednisone until done

## 2011-04-12 NOTE — Progress Notes (Signed)
  Subjective:    Patient ID: [name], female    DOB: Sep 08, 1936, 75 y.o.   MRN: [id number]  HPI 75 year old with  sarcoidosis involving her skin and lungs. Good response to 10 mg of prednisone , her dyspnea improved, and her skin lesions resolved.  Chest x-ray showed worsening of bilateral parenchymal scarring with chronic fibrotic changes. ACE level was 129.  10/09 dyspnea persists, desatn on exertion, FVC 52 %, TLC 57%, DLCO 32 % >> decreased   Spirometry 10/09 >> FVC decreased to 52% from 63% in 4/09, DLCO 32%. Started methotrexate 5 mg q sunday with LFT monitoring  September 19, 2009  stopped pulm rehab x 2 months due to knee swelling. Breathing better in cold weather. Stopped methotrexate - drug store would not fill, back on prednisone  FVC 57%, DLCO 38% - slightly better   March 23, 2010 Methotrexate was stopped in 11/10 due to diarrhea x 2 weeks, then resumed in 1/11 - 3/11, of agin since Rx ran out. Her skin lesions have returned per , daughter she is sob on minimal exertion. desatn today on less than a lap. CXr - stable stg 3 disease.    04/12/2011 Once again she stopped her methotrexate since refills ran out - never called [place]. Dr Artist Pais has  her on prednisone x 2 months. Continues to desatn on exertion. Reviewed CRX  02/13/11 - interstitial fibrosis persists, BMET , Ca & LFTs ok       Review of SystemsPt denies any significant  nasal congestion or excess secretions, fever, chills, sweats, unintended wt loss, pleuritic or exertional cp, orthopnea pnd or leg swelling.  Pt also denies any obvious fluctuation in symptoms with weather or environmental change or other alleviating or aggravating factors.    Pt denies any increase in rescue therapy over baseline, denies waking up needing it or having early am exacerbations or coughing/wheezing/ or dyspnea       Objective:   Physical Exam Gen. Pleasant, well-nourished, in no distress, normal affect ENT - no lesions, no post nasal  drip Neck: No JVD, no thyromegaly, no carotid bruits Lungs: no use of accessory muscles, no dullness to percussion, RLL rales, no rhonchi  Cardiovascular: Rhythm regular, heart sounds  normal, no murmurs or gallops, no peripheral edema Abdomen: soft and non-tender, no hepatosplenomegaly, BS normal. Musculoskeletal: No deformities, no cyanosis or clubbing SKin - lesion over rt chest wall, no extremity lesions         Assessment & Plan:

## 2011-04-12 NOTE — Assessment & Plan Note (Addendum)
Start back on methotrexate 10 mg q week Taper steroids to off - I feel side effects will be worse if used long term. Will need LFT monitoring in 3 mnths Start O2 to prevent long term effects of hypoxia - she is agreeable now I hope to see some general improvement in her fatigue & dyspnea

## 2011-04-30 NOTE — Assessment & Plan Note (Signed)
[facility]                             PULMONARY OFFICE NOTE   NAME:Markoff, SOHA THORUP                   MRN:          [id number]  DATE:10/07/2007                            DOB:          1936-08-29    Ms. Murtaugh is a pleasant 75 year old with recurrence of sarcoidosis  involving her skin and lungs.  On 10 mg of prednisone for the last two  weeks, her dyspnea has improved, and her skin lesions are resolving.  Chest x-ray showed worsening of bilateral parenchymal scarring with  chronic fibrotic changes.  ACE level was 129.  PFTs showed restriction  with an FVC of 57%, TLC of 51%, and DLC of 41%.  This is actually  slightly improved when compared to 2003.   CURRENT MEDICATIONS:  1. Prednisone 10 mg daily.  2. Advair 250/50 1 puff b.i.d.  3. Nexium 40 mg daily.  4. Avalide 300/12.5 mg 1/2 tablet daily.  5. Centrum Silver.   RECOMMENDATIONS:  1. She will continue 10 mg of prednisone for at least three months.      She will return for a followup with the chest x-ray then.  If she      is symptomatically improved, we will taper prednisone to off.  2. Pneumovax will be administered today.     Oretha Milch, MD  Electronically Signed    RVA/MedQ  DD: 10/07/2007  DT: 10/08/2007  Job #: [id number]   cc:   Olene Craven, M.D.

## 2011-04-30 NOTE — Assessment & Plan Note (Signed)
[facility]                             PULMONARY OFFICE NOTE   NAME:MAXWELLAdabella, Stanis                   MRN:          [id number]  DATE:09/18/2007                            DOB:          10-Nov-1936    REFERRING PHYSICIAN:  Olene Craven, M.D.   HISTORY OF PRESENT ILLNESS:  Ms. Wojtaszek is a pleasant 75 year old who  presented first in December 1998 with dyspnea and bilateral hilar  adenopathy without much evidence of parenchymal lung disease.  Mediastinoscopy, lymph node biopsy by Dr. Cornelius Moras, confirmed non-  necrotizing granulomas consistent with sarcoidosis.  Spirometry showed  an FEV1 of 58% and FVC of 54% with no evidence of airway obstruction  (FEV1/FVC was 78).  She was unable to perform maneuvers for lung  volumes.  Diffusion capacity was decreased at 30% but this did correct  for alveolar volume.  She was given prednisone for a short period and  then this was discontinued.  Her symptoms started worsening a few weeks ago.  She was dyspneic to the  point of even after walking a few steps.  She noticed purple red spots  on her legs which progressed to patches with hyperpigmentation.  She was  given a prednisone dose pack with marked improvement.  A chest x-ray  showed slight worsening of bilateral parenchymal scarring with some  chronic fibrotic changes.  She saw a dermatologist and was given local  ointment with improvement in her skin lesions.  She now reports a dry cough and improvement in her dyspnea on 10 of  prednisone.  She denies any blurring of vision or palpitations.   PAST MEDICAL HISTORY:  1. Hypertension.  2. Sarcoidosis as described above.  3. Allergic rhinitis.   PAST SURGICAL HISTORY:  1. Appendectomy 4 years ago.  2. Knee surgery in March 2007.   ALLERGIES:  None.   CURRENT MEDICATIONS:  1. Avalide 300/12.5 mg a half tablet daily.  2. Nexium 40 mg daily.  3. Prednisone 10 mg daily.  4. Centrum Silver daily.  5.  Advair 250/50 b.i.d. is listed but she is not taking this.   SOCIAL HISTORY:  Smoked about a half pack per day for 10 years, quit 25  years ago.  She is married and lives with her husband.  She used to work  as a Advertising copywriter.   FAMILY HISTORY:  Asthma in her sister.  Bone cancer in her mother.   REVIEW OF SYSTEMS:  Reports acid heartburn and occasional loss of  appetite with an un-quantified weight loss over the past few years.  Denies fevers, chills, or night sweats.   PHYSICAL EXAMINATION:  VITAL SIGNS:  Weight 191.8 pounds, temperature  97.8, blood pressure 140/96, heart rate 62, oxygen saturation 93% on  room air.  HEENT:  Class III airway.  No thrush.  NECK:  Supple.  No JVD.  No lymphadenopathy.  CV:  S1 S2 normal.  CHEST:  Bilateral fine crackles in the axillae.  No rhonchi.  ABDOMEN:  Soft, nontender.  NEUROLOGIC:  Nonfocal.  EXTREMITIES:  Hyper-pigmentation about lower extremities.   IMPRESSION:  1. Sarcoidosis involving  the skin and lungs with recurrence.  2. Restriction in pulmonary function tests related to above.   RECOMMENDATIONS:  1. She seems to have improved after the dose pack and is currently      stable on 10 mg of prednisone.  We will continue the current dose.  2. Spirometry and DLCO will be obtained to quantitate lung function.      If this is worse than the one from 2003, we will increase her dose      of steroids . Back then, she could not perform the lung volume      maneuvers and I do not think this is necessary to treat sarcoidosis      anyway.  An ACE level and serum calcium level will be estimated.  3. I would keep her on this dose of prednisone for at least 3 months      as suppression therapy before tapering her off.  4. Flu shot and Pneumovax will be administered when she is      symptomatically improved.     Oretha Milch, MD  Electronically Signed    RVA/MedQ  DD: 09/18/2007  DT: 09/18/2007  Job #: [id number]   cc:   Olene Craven,  M.D.

## 2011-05-03 NOTE — Discharge Summary (Signed)
[name], [name]            ACCOUNT NO.:  [id number]   MEDICAL RECORD NO.:  [id number]          PATIENT TYPE:  INP   LOCATION:  5008                         FACILITY:  [facility]   PHYSICIAN:  Claude Manges. Whitfield, M.D.DATE OF BIRTH:  07-02-1936   DATE OF ADMISSION:  03/14/2005  DATE OF DISCHARGE:  03/18/2005                                 DISCHARGE SUMMARY   ADMISSION DIAGNOSES:  1.  Painful right total knee arthroplasty with decreased range of motion.  2.  Hypertension.  3.  History of recent bronchitis.   DISCHARGE DIAGNOSES:  1.  Painful right total knee status post synovectomy and polyethylene      exchange with irrigation and debridement.  2.  Hypokalemia.  3.  Hypertension.  4.  Recent bronchitis.   SURGICAL PROCEDURE:  On March 14, 2005 [name] underwent a debridement  of her right total knee with a polyethelene exchange.  This was done open by  Dr. Claude Manges. Whitfield and [employer], P.[employer].-C.  She had an LCS complete  __________ standard 10-mm thickness and an LCS complete 3-peg, middle back  patella replacement size standard.   COMPLICATIONS:  None.   CONSULTANTS:  1.  Pharmacy consult for Coumadin therapy March 14, 2005.  2.  Rehab medicine consult March 15, 2005 in addition to a physical therapy      consult.  3.  Occupational therapy consult March 16, 2005.   HISTORY OF PRESENT ILLNESS:  This 75 year old black female patient presented  to Dr. Cleophas Dunker with a history of a right total knee done by him in July  2005.  She had been doing well until she fell 4 weeks ago; and, since that  time, she has had difficulty with range of motion and pain in the knee.  Because of that she is being taken to the OR for an open synovectomy,  manipulation, and possible polyethylene exchange.   HOSPITAL COURSE:  [name] tolerated her surgical procedure well without  immediate postoperative complications.  She was transferred to 5000.  On  postoperative day 1 she was  afebrile, vitals stable.  Right leg was  neurovascularly intact.  Incision benign.  Potassium was low at 3.1 and she  was supplemented with K-Dur.   She continued to make good, but slow progress over the next several days.  Her potassium remained low at 3.3 so supplements were increased.  She was  continued on therapy.  Today, March 18, 2005, she is doing very well.  She is  having very little pain and it is controlled with Vicodin.  Potassium has  improved to 4.1.  Right knee incision is well approximated with staples and  minimal drainage.  She is ambulating well and moving around the room.  Independently.  She feels that she is ready for discharge home and will be  discontinued home later today.   DISCHARGE INSTRUCTIONS:  Diet:  She can resume her regular  prehospitalization diet.   MEDICATIONS:  She may resume her prehospitalization meds; these include:  1.  Avalide 150/12.5 mg 1 tablet p.o. q.h.s.  2.  Vicodin 5/500 mg 1-2  tablets p.o. q.4-6h. p.r.n. for pain.  3.  Centrum Silver 1 tablet p.o. q.a.m.   ADDITIONAL MEDICATIONS:  1.  Coumadin 5 mg 1 tablet p.o. every 6 p.m. each day with pharmacy to      adjust the dose as needed.  She is given 40 with no refill.  2.  She is given an additional 45 Vicodin.   ACTIVITY:  She can be out of bed weightbearing as tolerated on the right leg  with the use of the walker.  She is to have a home CPM 0-100 degrees 6-8  hours a day, arrange home health PT per [facility] or [facility] --  please see the blue total knee discharge sheet for further activity  instructions.   WOUND CARE:  Please keep the right knee incision clean and dry.  Make sure  no drainage from the wound for 2 days.  Please see the blue total knee  discharge sheet for further wound care instructions.   FOLLOWUP:  She needs to follow up with Dr. Cleophas Dunker in our office in  approximately 10 days and needs to call [phone] to set up that appointment.   LABORATORY DATA:   Chest x-ray done on March 11, 2005 showed severe  interstitial fibrotic changes with minimal interval worsening of  interstitial accentuation.  On February 11, 2005 she had had a chest x-ray  that showed prominent interstitial markings with some findings due to  bronchitis, but pneumonia or possible viral pneumonia could be  consideration. She has had stable cardiomegaly which was mild and they could  not exclude hilar mediastinal adenopathy.  CT scan was recommended in  February with IV contrast media.   On March 31 hemoglobin 10.2, hematocrit 28.8.  On the 2nd hemoglobin 9.4,  hematocrit 28, white count 4.8 and platelets 206.  On April 1 PT 17.1, INR  1.6 and on April 3 PT 17.6 and INR 1.7.   On March 27 the potassium was 3.4.  It dropped to a low of 3.1 on March 31,  and now on April 3 it is now 4.1.  Calcium was 8.3 on the 31st with albumin  3.3 on the 27th.  Glucoses ranged from 93-111.   Urinalysis on March 27 showed 30 mg/dL of protein, a few epithelials, trace  leukocyte esterase, 0-2 white cells, many bacteria, yeast noted.  Other  laboratory studies were within normal limits.      KED/MEDQ  D:  03/18/2005  T:  03/18/2005  Job:  [id number]   cc:   Olene Craven, M.D.  [address]  [address]  [place]  [place] [address]  Fax: [phone]

## 2011-05-03 NOTE — Discharge Summary (Signed)
NAME:  [name], [name]                      ACCOUNT NO.:  [id number]   MEDICAL RECORD NO.:  [id number]                   Hayes TYPE:  INP   LOCATION:  5038                                 FACILITY:  [facility]   PHYSICIAN:  Claude Manges. Cleophas Dunker, M.D.            DATE OF BIRTH:  1935-12-28   DATE OF ADMISSION:  06/28/2004  DATE OF DISCHARGE:  07/04/2004                                 DISCHARGE SUMMARY   ADMISSION DIAGNOSES:  1.  End-stage osteoarthritis right knee bilateral knees right worse than      left.  2.  Hypertension.  3.  History of appendectomy and hiatal hernia repair.   DISCHARGE DIAGNOSES:  1.  End-stage osteoarthritis bilateral knees, status post right total knee      arthroplasty.  2.  Acute blood loss anemia secondary to surgery.  3.  Hypotension now resolved.  4.  Mild hyponatremia, now resolved.  5.  Mild hypokalemia.  6.  Hypertension.  7.  History of appendectomy and hiatal hernia repair.   PAST SURGICAL HISTORY:  On June 28, 2004, [name] underwent Desiree right  total knee arthroplasty by Dr. Claude Manges. Cleophas Dunker, assisted by Jamelle Rushing, P.Desiree.C.  [name] had Desiree DePuy MBT Keel tibial tray cemented size 2.5 with an  ALS complete primary femoral component cemented size standard right.  An LCS  complete metal back patella cemented size standard and an LCS complete RPN  insert size standard 10 mm thickness.   COMPLICATIONS:  None.   CONSULTATIONS:  1.  Pharmacy consult for Coumadin therapy June 28, 2004.  2.  Physical therapy, occupational therapy, and rehabilitation medicine      consult June 29, 2004.  3.  [facility] consult July 01, 2004.  4.  Case management consult July 03, 2004.   HISTORY OF PRESENT ILLNESS:  [name] presented to  Dr. Claude Manges. Whitfield with one to two year history of right knee pain.  The  pain is constant sharp sensation without radiation.  It is worse with  ambulation and rising from Desiree sitting to Desiree  standing position.  Tylenol does  help her pain Desiree bit but [name] has failed other conservative measures.  X-rays  do show severe osteoarthritis in both knees.  Because of her failure of  conservative treatment, [name] is presenting with right knee replacement.   HOSPITAL COURSE:  [name] tolerated her surgical procedure well without  immediate postoperative complications.  [name] was subsequently transferred to  5000.   On postoperative day one, [name] was afebrile.  Vital signs stable.  Hemoglobin  10.2, hematocrit 29.8.  [name] was started on therapy per protocol.   On postoperative day two, [name] complained of some dizziness when [name] got up.  Blood pressure 92/58.  Hemoglobin was 8.9, hematocrit 26.  [name] was  symptomatic with her anemia and so [name] was subsequently transfused two units  of packed red  blood cells.  Sodium was noted to be low and that was  monitored.   On postoperative day three, hemoglobin improved to 10.1, hematocrit 29.7.  Blood pressure was still low at 85/44 with Desiree heart rate of 81.  Blood  pressure medications had been held for two days and [name] had been transfused,  but because of the persistent hypotension, medical consultation by [facility] was obtained.  They followed her throughout her hospitalization.  [name] was switched to p.o. pain medications at that time.   By postoperative day four, [name] was doing well.  Blood pressure had improved  to 114/75, hemoglobin 10.3, hematocrit 29.8.  Sodium 137 and potassium 3.3.  Right knee incision was benign.  [name] was given some K-Dur to help with her  low potassium and labs were monitored.   [name] continued to make slow improvement over the next several days.  [name] felt  [name] would be stable for discharge home versus rehabilitation and on July 04, 2004 [name] was ready for discharge home.  [name] was afebrile.  Vital signs  stable.  Right knee incision was well approximated with staples and minimal  drainage.  [name] was discharged  home later that day.   DIET:  [name] can resume her regular prehospilization diet.   DISCHARGE INSTRUCTIONS:  [name] may resume her preoperative medications except  no blood pressure medications until [name] is seen by her primary care  physician.  This was her Avalide.   DISCHARGE MEDICATIONS:  1.  Coumadin to take as directed by the pharmacy for one month.  [name] is to      take one tablet at 6 p.m. each day and [facility] will      adjust that as needed.  2.  Vicodin 5/325 mg 1-2 tablets p.o. q.4h. p.r.n. pain (50 with no refill).   ACTIVITY:  [name] can be out of bed, partial weightbearing 50% or less on the  right leg with the use of Desiree walker.  [name] has arranged for home CPM 0 to 100  degrees six to eight hours Desiree day and home health PT per [facility].  Please see the blue total knee discharge sheet for further activity  and instructions.   WOUND CARE:  [name] is to keep the right knee incision clean and dry.  May  shower after no drainage from the wound for two days.  Please see the blue  total knee discharge sheet for further wound care instructions.   FOLLOW UP:  [name] needs to follow up with Dr. Claude Manges. Whitfield in our office  on Wednesday, July 11, 2004 and needs to call [phone] for that appointment.  [name] is to follow up with her primary care physician in approximately two  weeks.   LABORATORY DATA:  On June 29, 2004, hemoglobin 10.2, hematocrit 29.8,  platelets 120,000.  On the 16th, hemoglobin 8.9, hematocrit 26, and  platelets 118,000.  On 17th, hemoglobin 10.1, hematocrit 29.7, platelets  132,000.  On July 02, 2004, hemoglobin 10.3, hematocrit 29.8.  On June 29, 2004, PT 14.7.  On July 04, 2004, PT 23, INR 2.7.  On June 25, 2004, glucose  113, went to Desiree high of 146 on the 15th and Desiree low of 106 on the 18th.  Sodium ranged between Desiree low of 132 on the 16th to Desiree high of 137 on both the 11th and 18th.  Potassium ranged from Desiree low of 3.3 on  the 18th to Desiree high  of  4.1 on the 15th.  Calcium ranged from Desiree low of 7.4 on the 16th to 9.4 on the  11th and 8.2 on the 19th.  All other laboratory studies were within normal  limits.      Legrand Pitts Duffy, P.Desiree.                      Claude Manges. Cleophas Dunker, M.D.    KED/MEDQ  D:  08/14/2004  T:  08/14/2004  Job:  [id number]   cc:   Olene Craven, M.D.  [address]  [address]  [place]  [place] [address]  Fax: [phone]

## 2011-05-03 NOTE — H&P (Signed)
NAME:  [name], [name]                      ACCOUNT NO.:  [id number]   MEDICAL RECORD NO.:  [id number]                   PATIENT TYPE:  INP   LOCATION:  NA                                   FACILITY:  [facility]   PHYSICIAN:  Claude Manges. Cleophas Dunker, M.D.            DATE OF BIRTH:  01-22-1936   DATE OF ADMISSION:  06/28/2004  DATE OF DISCHARGE:                                HISTORY & PHYSICAL   CHIEF COMPLAINT:  Right knee pain.   HISTORY OF PRESENT ILLNESS:  [name] is a 75 year old African American  female with a one to two year history of right knee pain.  The patient  denies any injury or trauma to the right knee.  Right knee pain described as  a constant sharp pain with no radiation.  Mechanical symptoms positive for  locking and painful popping.  Pain worse with ambulation and rising from  sitting position. The patient sleeps with a pillow between her knees due to  the right knee pain.  Pain does not awaken her, however.  She takes Tylenol  for the right knee pain and this helps reduce the pain, however, it does not  totally relieve it.  The patient underwent Synvisc injections in the past.  These gave her no relief.  The patient was given a cortisone injection  last  week, one in each of her knees and since that time, has had no pain in  either knee.  She still has significant amount of stiffness in both knees.  X-rays of bilateral knees both show severe osteoarthritis. Right knee x-rays  show end-stage osteoarthritis with considerable osteoarthritis at the  patellofemoral joint.  The patient, therefore, is schedule for right total  knee arthroplasty at [facility] on June 28, 2004.   ALLERGIES:  No known drug allergies.Marland Kitchen   MEDICATIONS:  1. Avalide 300/12.5 mg one p.o. daily.  2. Tylenol p.r.n.   PAST MEDICAL HISTORY:  1. Hypertension.  2. History of appendectomy and hiatal hernia repair.   SOCIAL HISTORY:  The patient denies any tobacco or alcohol  use.  She is  married.  She has one adult child.  Primary care physician is [name], M.D., phone number is [phone].  The patient states that her  primary care physician, Dr. Barbee Hayes, is aware of her surgery.  She will  have him fax over a note saying that he feels that she is cleared for  surgery.  The patient lives in a two-story home with two steps at the usual  entrance.  She is retired.   FAMILY HISTORY:  The mother deceased at age 71 due to heart failure.  Unaware of any other medical conditions.  Father deceased at age 30 due to  heart failure.  The patient is unaware of any other heart condition.  Otherwise the patient has two living brothers and six living sisters with  the only known health  problems of diabetes mellitus.  She has four deceased  brothers and two deceased sisters.  Two of the brothers had heart failure,  one had lung cancer, one brother accidentally killed.  Two sisters, one had  heart failure and the other, she is unsure of the cause of death.   REVIEW OF SYMPTOMS:  The patient denies any recent cold, cough, fever, or  flulike symptoms.  She denies any chest pain, shortness of breath, PND,  orthopnea.  She has partial denture on the bottom and full denture  replacement on top.  She wears glasses.  She has shortness of breath with  exertion, however, she relates this to her bilateral knee pain.  She denies  any GI or GU problems.  She has nocturia two times at the most nightly.  She  denies any hematologic, pathologic, or neurologic problems. She denies heart  disease or diabetes.   PHYSICAL EXAMINATION:  GENERAL APPEARANCE:  The patient is a well-developed,  well-nourished, slightly overweight female who walks with an antalgic gait  and limp on the right.  The right leg is contracted and it does not fully  extend.  The patient's mood and affect are appropriate.  The patient talks  easily with examiner.  VITAL SIGNS:  Temperature 96.7, pulse 62,  respiratory rate 16, blood  pressure 120/72.  HEENT:  Normocephalic and atraumatic without frontal or maxillary sinus  tenderness to palpation.  Conjunctivae pink.  Sclerae nonicteric.  PERRLA.  EOM intact.  No visible external ear deformities are noted.  Nose and nasal  septum midline.  Nasal mucosa is pink and moist without polyps.  Oropharynx  is without erythema or exudate.  Tongue and uvula are midline.  NECK:  Trachea is midline. No lymphadenopathy.  No tenderness along the  cervical spine with palpation.  She has full range of motion of the cervical  spine.  Carotids are 2+ bilaterally and without bruits.  LUNGS:  Lungs are clear to auscultation bilaterally.  The patient has no  wheezing, rhonchi or rales.  CARDIOVASCULAR:  Regular rate and rhythm with no murmurs, rubs, or gallops  noted.  ABDOMEN:  Mild obesity.  Abdomen soft and nontender to palpation.  Bowel  sounds x4 quadrants.  BACK:  Nontender to palpation over the thoracic and lumbar spine.  BREASTS/GENITOURINARY/ RECTAL:  Examinations deferred at this time.  NEUROLOGIC:  Cranial nerves II-XII grossly intact. The patient is alert and  oriented x3.  Upper and lower extremity strength testing reveals strength to  be 5/5 throughout.  Deep tendon reflexes in the upper and lower extremities  are equal and symmetric bilaterally.  MUSCULOSKELETAL:  Upper extremities are equal and symmetric in size and  shape.  She has full range of motion of the upper extremities.  Radial  pulses 2+ bilaterally.  Lower extremities:  The patient has full range of  motion of both hips with internal and external and flexion of hip.  This is  nonpainful.  Right knee lacks 15 degrees in extension and flexion is to 80  degrees.  She has mild edema and no effusion.  Valgus and varus stressing of  the right knee reveals no laxity and causes no pain.  Palpation of the joint line is nontender in the medial and lateral compartments.  Left knee lacks 2  to  3 degrees in full extension, flexes to 95 degrees.  No edema, no  effusions noted.  Valgus and varus stressing reveals no laxity and causes no  pain.  Palpation of the joint line reveals no tenderness in the medial and  lateral compartments.  Lower extremities are nonedematous.  Dorsal and pedal  pulses are 2+ bilaterally.  EHL and SHL are intact.   IMPRESSION:  1. End-stage osteoarthritis right knee.  2. Left knee severe osteoarthritis.  3. Hypertension.  4. History of appendectomy and hiatal hernia repair.   PLAN:  The patient will be admitted to [facility] on  June 28, 2004, and is to undergo all preoperative labs and tests prior to  surgery.  The patient will also get clearance from her primary care  physician prior to surgery.  The patient is scheduled for a right total knee  arthroplasty on June 28, 2004, at [place] by Dr.  Cleophas Dunker.      Richardean Canal, P.A.                       Claude Manges. Cleophas Dunker, M.D.    GC/MEDQ  D:  06/20/2004  T:  06/20/2004  Job:  [id number]

## 2011-05-03 NOTE — Op Note (Signed)
NAME:  [name], [name]                      ACCOUNT NO.:  [id number]   MEDICAL RECORD NO.:  [id number]                   PATIENT TYPE:  INP   LOCATION:  5714                                 FACILITY:  [facility]   PHYSICIAN:  Ollen Gross. Vernell Morgans, M.D.              DATE OF BIRTH:  1936/08/17   DATE OF PROCEDURE:  01/05/2003  DATE OF DISCHARGE:                                 OPERATIVE REPORT   PREOPERATIVE DIAGNOSES:  1. Possible ruptured appendicitis.  2. Umbilical hernia.   POSTOPERATIVE DIAGNOSES:  1. Ruptured appendicitis with retroperitoneal abscess.  2. Umbilical hernia.   OPERATION PERFORMED:  1.  2. Umbilical hernia repair.   SURGEON:  Ollen Gross. Carolynne Edouard, M.D.   ANESTHESIA:  General endotracheal.   DESCRIPTION OF PROCEDURE:  After informed consent was obtained, the patient  was brought to the operating room and placed in supine position on the  operating table.  After adequate induction of general anesthesia, the  patient's abdomen was prepped with Betadine and draped in the usual sterile  manner.  A lower midline incision was made with a 10 blade knife.  This  incision was carried down through the skin and subcutaneous tissue using  Bovie electrocautery until the linea alba was identified.  The linea alba  was incised with the electrocautery and the preperitoneal space was probed  bluntly with a hemostat until the peritoneum was opened.  Access was gained  to the abdominal cavity.  The rest of the incision was then opened under  direct vision.  A Balfour retractor was used to retract the side walls of  the abdomen laterally and a Balfour extender was used to retract the small  bowel into the upper abdomen.  The patient was placed in some Trendelenburg  position and the abdomen was explored.  There was a dense inflammatory  reaction around the posterior aspect of the cecum.  An abscess cavity was  identified that communicated with the retroperitoneum but was contained and  self limited.  Blunt dissection was carried out in this area until the  appendix was identified.  The appendix was short but very thick and inflamed  and the tip of the appendix appeared to be the perforated area that  communicated with the abscess cavity.  The appendix was freed from the  mesoappendix using the harmonic scalpel.  Once this had been accomplished  the base of the appendix was divided using a endoscopic GIA stapler that was  clamped across the base of the appendix and fired, creating two staple lines  on either side of the division of the appendix.  The appendix was then freed  and removed from the patient and sent to pathology for further evaluation.  The staple line at the cecum was reinforced with several 2-0 silk figure-of-  eight sutures given the tenuousness of the staple line and the inflammation  present.  Once this was accomplished, the area  was examined and found to be  hemostatic and repair seemed to be intact.  The abdomen was then irrigated  with copious amounts of saline.  A 19 French round Blake drain was then  brought through the anterior abdominal wall laterally through a stab  incision and the tip of the drain placed in the retroperitoneal abscess  cavity.  The rest of the pelvis appeared clean.  The small bowel appeared  normal.  There were some minimal adhesions to an umbilical hernia which were  taken down sharply with electrocautery and the hernia defect at the fascial  layer was opened also with the electrocautery.  The placement of the NG tube  was checked and was in good position. There were no other abnormalities  noted.  The fascia of the anterior abdominal wall was then closed with two  running #1 PDS sutures.  The subcutaneous tissue was irrigated with copious  amounts of saline and the skin was closed with staples.  The drain was  anchored to the skin with a 2-0 silk stitch.  The patient tolerated the  procedure well.  At the end of the case all  sponge, needle and instrument  counts were correct.  The patient was awakened and taken to the recovery  room in stable condition.                                                  Ollen Gross. Vernell Morgans, M.D.    PST/MEDQ  D:  01/05/2003  T:  01/05/2003  Job:  [id number]

## 2011-05-03 NOTE — Op Note (Signed)
[name], WAMBLE            ACCOUNT NO.:  [id number]   MEDICAL RECORD NO.:  [id number]          PATIENT TYPE:  OIB   LOCATION:  2857                         FACILITY:  [facility]   PHYSICIAN:  Claude Manges. 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   POSTOPERATIVE DIAGNOSIS:  Adhesive capsulitis.   PROCEDURES:  1.  Open debridement of right knee.  2.  Polyethylene exchange of tibia plateau and patella.   SURGEON:  Claude Manges. Cleophas Dunker, M.D.   ASSISTANT:  Richardean Canal, P.A.   ANESTHESIA:  General orotracheal.   COMPLICATIONS:  None.   HISTORY:  This 76 year old female is approximately three weeks status post  injury to her right total knee replacement.  She had a fall and relates that  since that time she has been unable to full extend or flex her knee.  Her  knee has been in a fixed position with about 30 degrees of flexion and was  only able to flex to about 60 degrees.  X-rays do not appear to have any  particular abnormality with fracture or spinning over the polyethylene  component or even a deformity of the patella button, but she continues to  have pain and inability to move the knee beyond that point despite therapy  and medicine.   DESCRIPTION OF PROCEDURE:  With the patient comfortable on the operating  table and under general endotracheal anesthesia, I tried to gently  manipulate the right knee.  There was no movement beyond the 30 degrees of  flexor contracture or beyond the 60 degrees of flexion.  Accordingly, I  elected to open the knee.  A Foley was inserted.   A tourniquet was applied to the right lower extremity.  The leg was then  prepped with Betadine scrub and then Duraprep from the tourniquet to the  ankle.  Sterile draping was performed.  The previous longitudinal incision  was utilized and sharp dissection was carried down through subcutaneous  tissue.  The first layer of capsule was incised in the midline.  A medial  parapatellar incision was then made with the Bovie, removing the previous  Ethibond suture.  There was little if any joint effusion.  There was  considerable scar and cicatrix throughout the joint that bound the joint in  flexion and extension.  I did a throughout synovectomy.  I sent specimens  for culture and there were very few white cells and no organisms identified.  The knee was then gently flexed beyond 60 degrees to about 90 degrees as I  lysed adhesions and then did a further synovectomy in the superior pouch and  along both gutters.  At that point, I could flex the knee just beyond 90  degrees.  There was considerable scarring about the patella, which was  released along both gutters medially and laterally.  With further flexion, I  was able to evaluate the polyethylene  component.  There did not appear to be  any significant wear or pitting and there was no malrotation.  I was able to  place a single-point retractor behind the tibial component and then extend  it anteriorly.  At that point, I removed the polyethylene bearing, did a  further synovectomy posteriorly and released the capsule, at which point I  could easily flex the knee to a greater degree.  I did a polyethylene  exchange of the tibial component, inserting a standard 10 mm bearing, and I  was able to achieve at least 115 degrees of flexion easily.  I still lacked  maybe 7-8 degrees of full extension and further lysis of adhesions was  performed and synovectomy.  I also exchanged the polyethylene patellar  component.  The patella tracked in the midline.   The wound was then copiously irrigated with saline solution.  I did use the  tourniquet at 350 mmHg after esmarch exsanguination.  It was  deflated in  approximately 40 minutes.  Gross bleeders were Bovie coagulated.  The deep  capsule was closed with interrupted 0 Ethibond.  The superficial capsule was  closed with a running 0 Vicryl, subcutaneous with 2-0 Vicryl and skin closed  with skin clips.  A sterile bulky dressing was applied followed by an Ace  bandage and the patient's compression stocking.   The patient did receive a postoperative femoral block.  She tolerated the  procedure well without complications.      PWW/MEDQ  D:  03/14/2005  T:  03/14/2005  Job:  [id number]

## 2011-05-03 NOTE — Op Note (Signed)
[name], [name]            ACCOUNT NO.:  [id number]   MEDICAL RECORD NO.:  [id number]          PATIENT TYPE:  AMB   LOCATION:  ENDO                         FACILITY:  [facility]   PHYSICIAN:  Anselmo Rod, M.D.  DATE OF BIRTH:  Jan 30, 1936   DATE OF PROCEDURE:  06/10/2005  DATE OF DISCHARGE:                                 OPERATIVE REPORT   PROCEDURE PERFORMED:  Colonoscopy with snare polypectomy x2.   ENDOSCOPIST:  Anselmo Rod, M.D.   INSTRUMENT USED:  Olympus video colonoscope.   INDICATION FOR PROCEDURE:  A 75 year old African-American female with  abnormal weight loss, undergoing screening colonoscopy to rule out colonic  polyps, masses, etc.   PREPROCEDURE PREPARATION:  Informed consent was procured from the patient.  The patient was fasted for eight hours prior to the procedure and prepped  with a bottle of magnesium citrate and a gallon of GoLYTELY the night prior  to the procedure.  The risks and benefits of the procedure, including a 10%  miss rate of cancer and polyps, were discussed with the patient as well.   PREPROCEDURE PHYSICAL:  VITAL SIGNS:  The patient had stable vital signs.  NECK:  Supple.  CHEST:  Clear to auscultation.  S1, S2 regular.  ABDOMEN:  Soft with normal bowel sounds.   DESCRIPTION OF PROCEDURE:  The patient was placed in the left lateral  decubitus position and sedated with 50 mg of Demerol and 2.5 mg of Versed in  slow incremental doses.  Once the patient was adequately sedate and  maintained on low-flow oxygen and continuous cardiac monitoring, the Olympus  video colonoscope was advanced from the rectum to the cecum with slight  difficulty.  There was a significant amount of residual stool in the colon.  Multiple washes were done.  There was evidence of sigmoid diverticulosis.  A  sessile polyp was snared from the hepatic flexure.  This was small in size,  measuring about 2-3 mm.  Another flat polyp was snared from the mid-left  colon.  These were both removed by hot snare.  The appendiceal orifice and  the ileocecal valve were visualized and photographed.  No other  abnormalities were noted.  Retroflexion in the rectum revealed small  internal hemorrhoids.  The patient tolerated the procedure well without  complications.   IMPRESSION:  1.  Small, nonbleeding internal hemorrhoids.  2.  Sigmoid diverticulosis.  3.  A small flat polyp snared from mid-left colon, another one snared from      the hepatic flexure (hot snare x2).   RECOMMENDATIONS:  1.  Await pathology results.  2.  Avoid all nonsteroidals for the next two to three weeks.  3.  Proceed with an EGD at this time.  4.  Outpatient follow-up in the next four weeks or earlier if need be.       JNM/MEDQ  D:  06/11/2005  T:  06/11/2005  Job:  [id number]   cc:   Olene Craven, M.D.  [address]  [address]  [place]  [place] [address]  Fax: [phone]

## 2011-05-03 NOTE — Op Note (Signed)
[name], [name]            ACCOUNT NO.:  [id number]   MEDICAL RECORD NO.:  [id number]          PATIENT TYPE:  AMB   LOCATION:  ENDO                         FACILITY:  [facility]   PHYSICIAN:  Anselmo Rod, M.D.  DATE OF BIRTH:  [birth year]   DATE OF PROCEDURE:  06/10/2005  DATE OF DISCHARGE:                                 OPERATIVE REPORT   PROCEDURE PERFORMED:  Esophagogastroduodenoscopy with biopsies.   ENDOSCOPIST:  Charna Elizabeth, M.D.   INSTRUMENT USED:  Olympus video panendoscope.   INDICATIONS FOR PROCEDURE:  The patient is a 75 year old African-American  female with a history of abnormal weight loss.  Rule out peptic ulcer  disease, gastric masses, polyps, etc.   PREPROCEDURE PREPARATION:  Informed consent was procured from the patient.  The patient was fasted for eight hours prior to the procedure.   PREPROCEDURE PHYSICAL:  The patient had stable vital signs.  Neck supple,  chest clear to auscultation.  S1, S2 regular.  Abdomen soft with normal  bowel sounds.   DESCRIPTION OF PROCEDURE:  The patient was placed in the left lateral  decubitus position and sedated with an additional 1.5 mg of Versed.  Once  the patient was adequately sedated and maintained on low-flow oxygen and  continuous cardiac monitoring, the Olympus video panendoscope was advanced  through the mouth piece over the tongue into the esophagus under direct  vision.  The entire esophagus appeared normal with no evidence of ring,  stricture, masses, esophagitis or Barrett's mucosa.  The scope was then  advanced to the stomach.  The patient had edematous gastric folds with  erythematous mottling.  Biopsies were done to rule out Helicobacter pylori  by pathology. A small hiatal hernia was seen on high retroflexion.  The  proximal small bowel appeared normal.  No ulcers, erosions, masses or polyps  were identified.  The patient tolerated the procedure well without  complications.   IMPRESSION:  1.   Edematous erythematous gastric folds, biopsy done for Helicobacter      pylori.  2.  Small hiatal hernia.  3.  Normal esophagus and proximal small bowel.   RECOMMENDATIONS:  1.  Await pathology results.  2.  Avoid all nonsteroidals for now.  3.  Outpatient followup in the next four weeks for further recommendations.       JNM/MEDQ  D:  06/10/2005  T:  06/10/2005  Job:  [id number]   cc:   Olene Craven, M.D.  [address]  [address]  [place]  [place] [address]  Fax: [phone]

## 2011-05-03 NOTE — Discharge Summary (Signed)
   NAME:  [name], [name]                      ACCOUNT NO.:  [id number]   MEDICAL RECORD NO.:  [id number]                   PATIENT TYPE:  INP   LOCATION:  5714                                 FACILITY:  [facility]   PHYSICIAN:  Olene Craven, M.D.            DATE OF BIRTH:  01/19/1936   DATE OF ADMISSION:  01/03/2003  DATE OF DISCHARGE:  01/08/2003                                 DISCHARGE SUMMARY   DISCHARGE DIAGNOSES:  1. Ruptured appendix with inflammatory phlegmon.  2. Sarcoidosis.   CONSULTANTS:  Dr. Carolynne Edouard, general surgery.   PROCEDURE:  Exploratory laparotomy with appendectomy.   FOLLOW UP:  The patient is to follow up with Dr. Carolynne Edouard as directed and with  Dr. Delanna Notice in two weeks time.   DISCHARGE MEDICATIONS:  Vicodin one to two tablets p.o. q.6h. p.r.n.   HOSPITAL COURSE:  The patient was admitted on 01/03/03 after experiencing a  two day history of right lower quadrant tenderness and fever. She was  admitted for further evaluation. CT scan was done which noted inflammatory  phlegmon of the right lower quadrant consistent with a ruptured appendix.  Dr. Carolynne Edouard was consulted. The patient was taken to surgery on 01/05/00 for a  exploratory laparotomy appendectomy and drainage of abscess. Drainage tubes  were left in place. She was continued on IV antibiotics. Diet was eventually  resumed. The patient's drains were pulled, and the patient was discharged  tolerating a full diet, afebrile, white count under 6,000. She was to follow  up as above.                                                Olene Craven, M.D.    DEH/MEDQ  D:  02/02/2003  T:  02/03/2003  Job:  [id number]

## 2011-05-03 NOTE — Op Note (Signed)
NAME:  ARORA, COAKLEY                      ACCOUNT NO.:  [id number]   MEDICAL RECORD NO.:  [id number]                   PATIENT TYPE:  INP   LOCATION:  2899                                 FACILITY:  [facility]   PHYSICIAN:  Claude Manges. Cleophas Dunker, M.D.            DATE OF BIRTH:  July 25, 1936   DATE OF PROCEDURE:  06/28/2004  DATE OF DISCHARGE:                                 OPERATIVE REPORT   PREOPERATIVE DIAGNOSIS:  End stage osteoarthritis, right knee.   POSTOPERATIVE DIAGNOSIS:  End stage osteoarthritis, right knee.   OPERATION PERFORMED:  Right total knee replacement.   SURGEON:  Claude Manges. Cleophas Dunker, M.D.   ASSISTANT:  Jamelle Rushing, P.A.   ANESTHESIA:  General orotracheal anesthesia.   COMPLICATIONS:  None.   COMPONENTS:  Depuy LCS complete standard femoral 2.5 rotating tibial keeled  tray with a 10 mm bridging bearing and a metal backed, three pegged patella.  All were secured with polymethyl methacrylate.   DESCRIPTION OF PROCEDURE:  With the patient comfortable on the operating  table and under general orotracheal anesthesia, nursing staff inserted a  Foley catheter.  The right lower extremity was then placed in a thigh  tourniquet.  The leg was then prepped with Betadine scrub and then DuraPrep  from the tourniquet to the midfoot.  Sterile draping was performed.  With  the extremity still elevated, it was Esmarch exsanguinated with the proximal  tourniquet at 350 mmHg.   A midline longitudinal incision was made centered over the patella extending  from the superior pouch to the tibial tubercle.  By sharp dissection,  incision was carried down to the subcutaneous tissue.  First layer of  capsule was incised with a Bovie.  A medial parapatellar incision was made  with a Bovie to the deep capsule.  There was minimal clear yellow joint  effusion. The patient had a significant flexion contracture preoperatively  approximately 30 degrees and flexed 90 degrees.  With some  difficulty I was  able to evert the patella but there was a significant amount of inflammatory  synovitis that was sharply incised.  It had what appeared to be normal fatty  tissue in both gutters and superior pouch.  At that point, I could evert the  patella.  The knee was flexed to about 90 degrees after lysing adhesions.  There was acute loss of articular cartilage on the medial compartment, for  the most part on the lateral compartment as well as the patellofemoral  joint.  Large osteophytes were sharply excised.   Preoperatively, we had measured standard femoral components.  This was  confirmed intraoperatively.  The initial bony cut was made transversely on  the tibia with a 7 degree posterior inclination.  Subsequent cuts were the  made on the femur with a 4 degree distal femoral valgus cut.  ACL and PCL  were sacrificed.  MCL and LCL remained intact throughout the operative  procedure.  Lamina  spreaders were inserted to remove medial and lateral  menisci and any remaining synovitis posteriorly as well as osteophytes along  the posterior femoral condyles medially and laterally.  Further inflammatory  synovitis was excised from the gutters.   Retractor was then placed behind the tibia.  The center hole was then made  followed by the keeled cut.  The 2-1/2 sized tibia was then impacted as a  trial.  A 10 mm bridging bearing was inserted as both flexion and extension  gaps were symmetrical at 10 mm.  Standard femoral component was applied and  the knee was placed through full range of motion.  With full quick  extension, there was no opening with varus or valgus stress.  Nor was there  an anterior drawer sign.  All components rotated normally.   Patella was prepared by removing approximately 8 to 9 mm of bone leaving 13  mm bone thickness.  The three pegged jig was applied.  Then the trial  patella was applied.  There was significant lateral subluxation.  A lateral  release was  performed.  At that point the patella tracked in the midline.  Trial components were removed.  The joint was copiously irrigated with jet  saline.  Final components were then impacted with polymethyl methacrylate.  Extraneous methacrylate was removed.  The knee was placed in extension. The  patellar clamp applied.  After maturation and hardening of the methacrylate  the joint was inspected and any remaining extraneous methacrylate was  removed with an osteotome.  The joint was then again irrigated with jet  saline.  The knee was placed through a full range of motion of motion with  full extension and flexion without abnormality of the components.   Tourniquet was deflated.  Gross bleeders were Bovie coagulated.  Bone wax  was applied along some of the bleeding bone surface.  The deep capsule was  closed with a #1 Ethibond.  The superficial capsule closed with running 0  Vicryl.  Subcutaneous closed with 2-0 Vicryl.  Skin closed with skin clips.  Sterile bulky dressing was applied followed by the patient's support  stocking.  The patient tolerated the procedure without complications.                                               Claude Manges. Cleophas Dunker, M.D.    PWW/MEDQ  D:  06/28/2004  T:  06/28/2004  Job:  [id number]

## 2011-05-20 ENCOUNTER — Ambulatory Visit (HOSPITAL_BASED_OUTPATIENT_CLINIC_OR_DEPARTMENT_OTHER)
Admission: RE | Admit: 2011-05-20 | Discharge: 2011-05-20 | Disposition: A | Discharge status: Home / Self Care | Payer: Medicare Other | Source: Ambulatory Visit | Attending: Family | Admitting: Family

## 2011-05-20 ENCOUNTER — Ambulatory Visit (INDEPENDENT_AMBULATORY_CARE_PROVIDER_SITE_OTHER): Payer: Medicare Other | Admitting: Family

## 2011-05-20 ENCOUNTER — Telehealth: Payer: Self-pay | Admitting: Family

## 2011-05-20 ENCOUNTER — Encounter: Payer: Self-pay | Admitting: Family

## 2011-05-20 VITALS — BP 130/78 | HR 66 | Temp 97.8°F | Resp 18 | Ht 67.01 in

## 2011-05-20 DIAGNOSIS — R0989 Other specified symptoms and signs involving the circulatory and respiratory systems: Secondary | ICD-10-CM | POA: Insufficient documentation

## 2011-05-20 DIAGNOSIS — I517 Cardiomegaly: Secondary | ICD-10-CM | POA: Insufficient documentation

## 2011-05-20 DIAGNOSIS — R059 Cough, unspecified: Secondary | ICD-10-CM | POA: Insufficient documentation

## 2011-05-20 DIAGNOSIS — R05 Cough: Secondary | ICD-10-CM | POA: Insufficient documentation

## 2011-05-20 DIAGNOSIS — J40 Bronchitis, not specified as acute or chronic: Secondary | ICD-10-CM

## 2011-05-20 DIAGNOSIS — M47814 Spondylosis without myelopathy or radiculopathy, thoracic region: Secondary | ICD-10-CM | POA: Insufficient documentation

## 2011-05-20 DIAGNOSIS — D869 Sarcoidosis, unspecified: Secondary | ICD-10-CM

## 2011-05-20 MED ORDER — AZITHROMYCIN 250 MG PO TABS: ORAL_TABLET | ORAL | Status: AC

## 2011-05-20 MED ORDER — OLMESARTAN MEDOXOMIL-HCTZ 40-12.5 MG PO TABS: 1.0000 | ORAL_TABLET | Freq: Every day | ORAL | Status: DC

## 2011-05-20 NOTE — Progress Notes (Signed)
  Subjective:    Patient ID: [name], female    DOB: 06/13/1936, 75 y.o.   MRN: [id number]  HPI  Ms.  Hayes is a 75 year old female with history of sarcoidosis who presents today with chief complaint of cough.  She reports that her chronic cough is usually dry and only at night.  She notes + cough, "all day long" x 4 days.  + nasal congestion- clear.  Denies associated fever.  + chills yesterday.  Has not tried any OTC meds.  She reports that Dr. Vassie Loll tapered her prednisone off.  Last dose 10 days ago.  Now on methotrexate.  Denies associated ear pain or sore throat.   Review of Systems See HPI  Past Medical History  Diagnosis Date  . Sarcoidosis   . Unspecified essential hypertension   . Allergic rhinitis, cause unspecified   . Esophageal reflux   . Vertigo     History   Social History  . Marital Status: Married    Spouse Name: N/A    Number of Children: N/A  . Years of Education: N/A   Occupational History  . Retired    Social History Main Topics  . Smoking status: Former Smoker -- 0.8 packs/day    Types: Cigarettes    Quit date: 12/16/1985  . Smokeless tobacco: Never Used  . Alcohol Use: No  . Drug Use: Not on file  . Sexually Active: Not on file   Other Topics Concern  . Not on file   Social History Narrative  . No narrative on file    Past Surgical History  Procedure Date  . Appendectomy   . Knee arthroscopy     Family History  Problem Relation Age of Onset  . Asthma Sister   . Bone cancer Mother   . Diabetes type II Sister     No Known Allergies  Current Outpatient Prescriptions on File Prior to Visit  Medication Sig Dispense Refill  . Cholecalciferol (VITAMIN D) 2000 UNITS CAPS Take 1 capsule by mouth daily.        . Multiple Vitamins-Minerals (CENTRUM SILVER PO) Take 1 tablet by mouth daily.        Marland Kitchen DISCONTD: olmesartan-hydrochlorothiazide (BENICAR HCT) 40-12.5 MG per tablet Take 1 tablet by mouth daily.        Marland Kitchen DISCONTD:  predniSONE (DELTASONE) 10 MG tablet Take 10 mg by mouth daily.          BP 130/78  Pulse 66  Temp(Src) 97.8 F (36.6 C) (Oral)  Resp 18  Ht 5' 7.01" (1.702 m)  SpO2 96%       Objective:   Physical Exam  Constitutional: She appears well-developed and well-nourished.  Cardiovascular: Normal rate and regular rhythm.   Pulmonary/Chest: Effort normal.       + crackles noted at right base. Pt wearing portable oxygen  Musculoskeletal:       Trace bilateral lower extremity edema.  Skin: Skin is warm and dry.  Psychiatric: She has a normal mood and affect. Her behavior is normal. Thought content normal.          Assessment & Plan:

## 2011-05-20 NOTE — Telephone Encounter (Signed)
CXR shows bronchitis.  Called patient, reviewed plans to start zithromax and follow up in 2 weeks, sooner if symptoms worsen or do not improve. She verbalizes understanding.

## 2011-05-20 NOTE — Assessment & Plan Note (Addendum)
CXR performed today- notes changes consistent with bronchitis. Plan to treat with zithromax. See phone note.

## 2011-05-20 NOTE — Patient Instructions (Signed)
Please complete your x-ray on the first floor. Follow up in 2 weeks.

## 2011-05-21 ENCOUNTER — Other Ambulatory Visit: Payer: Self-pay | Admitting: Pulmonary Disease

## 2011-05-21 DIAGNOSIS — R0902 Hypoxemia: Secondary | ICD-10-CM

## 2011-05-23 ENCOUNTER — Encounter: Payer: Self-pay | Admitting: Pulmonary Disease

## 2011-07-12 ENCOUNTER — Ambulatory Visit: Payer: Medicare Other | Admitting: Adult Health

## 2011-07-17 ENCOUNTER — Ambulatory Visit: Payer: Medicare Other | Admitting: Adult Health

## 2011-07-23 ENCOUNTER — Other Ambulatory Visit (INDEPENDENT_AMBULATORY_CARE_PROVIDER_SITE_OTHER): Payer: Medicare Other

## 2011-07-23 ENCOUNTER — Ambulatory Visit (INDEPENDENT_AMBULATORY_CARE_PROVIDER_SITE_OTHER): Payer: Medicare Other | Admitting: Adult Health

## 2011-07-23 ENCOUNTER — Encounter: Payer: Self-pay | Admitting: Adult Health

## 2011-07-23 VITALS — BP 144/94 | HR 79 | Temp 98.2°F | Ht 67.0 in | Wt 226.4 lb

## 2011-07-23 DIAGNOSIS — D869 Sarcoidosis, unspecified: Secondary | ICD-10-CM

## 2011-07-23 LAB — HEPATIC FUNCTION PANEL
ALT: 9 U/L (ref 0–35)
AST: 15 U/L (ref 0–37)
Albumin: 3.6 g/dL (ref 3.5–5.2)
Alkaline Phosphatase: 46 U/L (ref 39–117)
Total Protein: 7.3 g/dL (ref 6.0–8.3)

## 2011-07-23 LAB — BASIC METABOLIC PANEL
Calcium: 8.8 mg/dL (ref 8.4–10.5)
Creatinine, Ser: 0.8 mg/dL (ref 0.4–1.2)
GFR: 86.75 mL/min (ref >60.00)

## 2011-07-23 NOTE — Patient Instructions (Signed)
Continue on Methotrexate 2.5mg  4 tabs weekly.  Wear Oxygen  With activity and sleep.  Call for flu shot in September.  follow up Dr. Vassie Loll  In 3 months  Please contact office for sooner follow up As needed   I will call with lab results.

## 2011-07-23 NOTE — Progress Notes (Signed)
Subjective:    Patient ID: [name], female    DOB: 1936/04/13, 75 y.o.   MRN: [id number]  HPI  75 year old with  sarcoidosis involving her skin and lungs. Good response to 10 mg of prednisone , her dyspnea improved, and her skin lesions resolved.  Chest x-ray showed worsening of bilateral parenchymal scarring with chronic fibrotic changes. ACE level was 129.  10/09 dyspnea persists, desatn on exertion, FVC 52 %, TLC 57%, DLCO 32 % >> decreased   Spirometry 10/09 >> FVC decreased to 52% from 63% in 4/09, DLCO 32%. Started methotrexate 5 mg q sunday with LFT monitoring  September 19, 2009  stopped pulm rehab x 2 months due to knee swelling. Breathing better in cold weather. Stopped methotrexate - drug store would not fill, back on prednisone  FVC 57%, DLCO 38% - slightly better   March 23, 2010 Methotrexate was stopped in 11/10 due to diarrhea x 2 weeks, then resumed in 1/11 - 3/11, of agin since Rx ran out. Her skin lesions have returned per , daughter she is sob on minimal exertion. desatn today on less than a lap. CXr - stable stg 3 disease.    04/12/2011 Once again she stopped her methotrexate since refills ran out - never called [place]. Dr Artist Pais has  her on prednisone x 2 months. Continues to desatn on exertion. Reviewed CRX  02/13/11 - interstitial fibrosis persists, BMET , Ca & LFTs ok>>tapered off , restarted methotrexate weekly , started on O2 act/sleep   07/23/2011 3 month follow up  Pt returns today for follow up . She had bronchitis couple months ago, tx with abx, w/ resolution. Says she is doing well. She has tapered completely off steroids from last ov in April this year. She is tolerating Methotrexate 10mg  weekly with no known trouble.  No increased skin lesions or cough . No hemoptysis . CXR 05/2011 w/ sarcoid changes. No acute process.  Doing well with O2 , sleeping better and less DOE.    Review of Systems Constitutional:   No  weight loss, night sweats,  Fevers, chills,  fatigue, or  lassitude.  HEENT:   No headaches,  Difficulty swallowing,  Tooth/dental problems, or  Sore throat,                No sneezing, itching, ear ache, nasal congestion, post nasal drip,   CV:  No chest pain,  Orthopnea, PND, swelling in lower extremities, anasarca, dizziness, palpitations, syncope.   GI  No heartburn, indigestion, abdominal pain, nausea, vomiting, diarrhea, change in bowel habits, loss of appetite, bloody stools.   Resp:  No excess mucus, no productive cough,  No non-productive cough,  No coughing up of blood.  No change in color of mucus.  No wheezing.  No chest wall deformity  Skin: no rash or lesions.  GU: no dysuria, change in color of urine, no urgency or frequency.  No flank pain, no hematuria   MS:  No joint pain or swelling.  No decreased range of motion.  No back pain.  Psych:  No change in mood or affect. No depression or anxiety.  No memory loss.         Objective:   Physical Exam  Gen. Pleasant, well-nourished, in no distress, normal affect ENT - no lesions, no post nasal drip Neck: No JVD, no thyromegaly, no carotid bruits Lungs: no use of accessory muscles, no dullness to percussion, bibasilar crackles, no rhonchi  Cardiovascular: Rhythm regular, heart sounds  normal, no murmurs or gallops, no peripheral edema Abdomen: soft and non-tender, no hepatosplenomegaly, BS normal. Musculoskeletal: No deformities, no cyanosis or clubbing SKin - left lower leg hyperpigmented sarcoid lesion         Assessment & Plan:

## 2011-07-23 NOTE — Assessment & Plan Note (Signed)
Compensated on present regimen. No flare off steroids.  Tolerating methotrexate  bmet and lfts pending.   Plan:  Continue on Methotrexate 2.5mg  4 tabs weekly.  Wear Oxygen  With activity and sleep.  Call for flu shot in September.  follow up Dr. Vassie Loll  In 3 months  Please contact office for sooner follow up As needed   I will call with lab results.

## 2011-07-24 ENCOUNTER — Ambulatory Visit: Payer: Medicare Other | Admitting: Internal Medicine

## 2011-08-22 ENCOUNTER — Telehealth: Payer: Self-pay | Admitting: Pulmonary Disease

## 2011-08-22 MED ORDER — METHOTREXATE 2.5 MG PO TABS: ORAL_TABLET | ORAL | Status: DC

## 2011-08-22 NOTE — Telephone Encounter (Signed)
Called and spoke with pt.  Informed her rx sent to pharmacy. Pt also scheduled 3 month f/u appt with RA for 10/29/11 at 1:45 pm.

## 2011-10-29 ENCOUNTER — Encounter: Payer: Self-pay | Admitting: Pulmonary Disease

## 2011-10-29 ENCOUNTER — Ambulatory Visit (INDEPENDENT_AMBULATORY_CARE_PROVIDER_SITE_OTHER): Payer: Medicare Other | Admitting: Pulmonary Disease

## 2011-10-29 VITALS — BP 100/78 | HR 102 | Temp 97.9°F | Ht 67.0 in | Wt 220.0 lb

## 2011-10-29 DIAGNOSIS — D869 Sarcoidosis, unspecified: Secondary | ICD-10-CM

## 2011-10-29 DIAGNOSIS — Z23 Encounter for immunization: Secondary | ICD-10-CM

## 2011-10-29 NOTE — Progress Notes (Signed)
  Subjective:    Patient ID: [name], female    DOB: 12/17/1935, 75 y.o.   MRN: [id number]  HPI 75 year old for FU of  sarcoidosis involving her skin and lungs.  Good response to 10 mg of prednisone , her dyspnea improved, and her skin lesions resolved.  Chest x-ray showed worsening of bilateral parenchymal scarring with chronic fibrotic changes. ACE level was 129.   Spirometry 10/09 >> FVC decreased to 52% from 63% in 4/09, TLC 57%,  DLCO 32%. Started methotrexate 5 mg q sunday with LFT monitoring   October, 2010 -stopped pulm rehab x 2 months due to knee swelling. Breathing better in cold weather. Stopped methotrexate - drug store would not fill, back on prednisone  FVC 57%, DLCO 38% - slightly better   4/12 started on methotrexate weekly , started on O2 act/sleep    10/29/2011 3 mnth FU pt states SOB is " a little bit better" but she does c/o increased dry cough that is worse at night Compliant with methotrexate - no new skin lesions Compliant with O2 when walking & sleep.    Review of Systems Pt denies any significant  nasal congestion or excess secretions, fever, chills, sweats, unintended wt loss, pleuritic or exertional cp, orthopnea pnd or leg swelling.  Pt also denies any obvious fluctuation in symptoms with weather or environmental change or other alleviating or aggravating factors.    Pt denies any increase in rescue therapy over baseline, denies waking up needing it or having early am exacerbations or coughing/wheezing/ or dyspnea      Objective:   Physical Exam Gen. Pleasant, well-nourished, in no distress ENT - no lesions, no post nasal drip Neck: No JVD, no thyromegaly, no carotid bruits Lungs: no use of accessory muscles, no dullness to percussion, clear without rales or rhonchi  Cardiovascular: Rhythm regular, heart sounds  normal, no murmurs or gallops, 1+ peripheral edema Musculoskeletal: No deformities, no cyanosis or clubbing         Assessment  & Plan:

## 2011-10-29 NOTE — Patient Instructions (Addendum)
Decrease to 3 tabs once/ week Sarcoidosis seems stable Blood work for liver function DELSYM 2 tsp every night for cough

## 2011-10-29 NOTE — Assessment & Plan Note (Signed)
Improvement in lung function & symptoms have suggested some reversible sarcoidosis with steroids. However limited by wt gain & hyperglycemia. On mtx since 4/12 Last PFTs sep '11 FVC 56%, TLC 57%, DLCO 30% Decrease to 3 tabs once/ week Sarcoidosis seems stable Blood work for liver function DELSYM 2 tsp every night for cough

## 2012-03-12 ENCOUNTER — Ambulatory Visit (INDEPENDENT_AMBULATORY_CARE_PROVIDER_SITE_OTHER): Payer: Medicare Other | Admitting: Pulmonary Disease

## 2012-03-12 ENCOUNTER — Ambulatory Visit (INDEPENDENT_AMBULATORY_CARE_PROVIDER_SITE_OTHER)
Admission: RE | Admit: 2012-03-12 | Discharge: 2012-03-12 | Disposition: A | Discharge status: Home / Self Care | Payer: Medicare Other | Source: Ambulatory Visit | Attending: Pulmonary Disease | Admitting: Pulmonary Disease

## 2012-03-12 ENCOUNTER — Encounter: Payer: Self-pay | Admitting: Pulmonary Disease

## 2012-03-12 ENCOUNTER — Other Ambulatory Visit (INDEPENDENT_AMBULATORY_CARE_PROVIDER_SITE_OTHER): Payer: Medicare Other

## 2012-03-12 VITALS — BP 132/80 | HR 83 | Temp 98.3°F | Ht 67.0 in | Wt 221.8 lb

## 2012-03-12 DIAGNOSIS — D869 Sarcoidosis, unspecified: Secondary | ICD-10-CM

## 2012-03-12 LAB — CBC WITH DIFFERENTIAL/PLATELET
Basophils Relative: 0.7 % (ref 0.0–3.0)
Eosinophils Relative: 4.3 % (ref 0.0–5.0)
HCT: 43.9 % (ref 36.0–46.0)
Hemoglobin: 14.5 g/dL (ref 12.0–15.0)
Lymphs Abs: 1.2 10*3/uL (ref 0.7–4.0)
Monocytes Relative: 8 % (ref 3.0–12.0)
Neutro Abs: 3.4 10*3/uL (ref 1.4–7.7)
WBC: 5.3 10*3/uL (ref 4.5–10.5)

## 2012-03-12 LAB — COMPREHENSIVE METABOLIC PANEL
CO2: 35 mEq/L — ABNORMAL HIGH (ref 19–32)
Creatinine, Ser: 0.9 mg/dL (ref 0.4–1.2)
GFR: 82.01 mL/min (ref >60.00)
Glucose, Bld: 96 mg/dL (ref 70–99)
Total Bilirubin: 1 mg/dL (ref 0.3–1.2)

## 2012-03-12 NOTE — Progress Notes (Signed)
  Subjective:    Patient ID: [name], female    DOB: 1936-04-19, 76 y.o.   MRN: [id number]  HPI  76 year old for FU of sarcoidosis involving her skin and lungs.  Good response to 10 mg of prednisone , her dyspnea improved, and her skin lesions resolved.  Chest x-ray showed worsening of bilateral parenchymal scarring with chronic fibrotic changes. ACE level was 129.  >> Improvement in lung function & symptoms have suggested some reversible sarcoidosis with steroids. However limited by wt gain & hyperglycemia. On mtx since 4/12  Last PFTs sep '11 FVC 56%, TLC 57%, DLCO 30%  - comapre to FVC 63% in 10/09  4/12 started on methotrexate weekly , started on O2 act/sleep  Could not finish pulm rehab due to knee issues   03/12/2012 Pt states her breathing has unchanged, wheezing some, dry cough at night Has been unable to see Dr Artist Pais Compliant with methotrexate - no new skin lesions  Compliant with O2 when walking & sleep- desatn in office today, did not bring her O2 CXR - unchanged scarring  Labs- LFTs ok  Review of Systems Patient denies significant dyspnea,cough, hemoptysis,  chest pain, palpitations, pedal edema, orthopnea, paroxysmal nocturnal dyspnea, lightheadedness, nausea, vomiting, abdominal or  leg pains      Objective:   Physical Exam  Gen. Pleasant, well-nourished, in no distress ENT - no lesions, no post nasal drip Neck: No JVD, no thyromegaly, no carotid bruits Lungs: no use of accessory muscles, no dullness to percussion, BL scattered rales, no rhonchi  Cardiovascular: Rhythm regular, heart sounds  normal, no murmurs or gallops, no peripheral edema Musculoskeletal: No deformities, no cyanosis or clubbing        Assessment & Plan:

## 2012-03-12 NOTE — Patient Instructions (Addendum)
We will make appt with Dr Artist Pais Blood work & chest xray today Stay on 3 tabs of methotrexate Stay on oxygen when you walk

## 2012-03-14 NOTE — Assessment & Plan Note (Signed)
Blood work & chest xray  - reviewed -Ltfs ok, unchanged scarring Stay on 3 tabs of methotrexate She has to be more compliant with oxygen on walking & during sleep We will help her make appt with Dr Artist Pais

## 2012-03-17 ENCOUNTER — Other Ambulatory Visit: Payer: Self-pay | Admitting: *Deleted

## 2012-03-17 MED ORDER — METHOTREXATE 2.5 MG PO TABS: 7.5000 mg | ORAL_TABLET | ORAL | Status: DC

## 2012-03-20 ENCOUNTER — Ambulatory Visit: Payer: Medicare Other | Admitting: Internal Medicine

## 2012-03-27 ENCOUNTER — Ambulatory Visit (INDEPENDENT_AMBULATORY_CARE_PROVIDER_SITE_OTHER): Payer: Medicare Other | Admitting: Internal Medicine

## 2012-03-27 ENCOUNTER — Encounter: Payer: Self-pay | Admitting: Internal Medicine

## 2012-03-27 VITALS — BP 132/82 | HR 80 | Temp 99.1°F | Wt 219.0 lb

## 2012-03-27 DIAGNOSIS — I10 Essential (primary) hypertension: Secondary | ICD-10-CM

## 2012-03-27 MED ORDER — OLMESARTAN MEDOXOMIL-HCTZ 20-12.5 MG PO TABS: 0.5000 | ORAL_TABLET | Freq: Every day | ORAL | Status: AC

## 2012-03-27 NOTE — Progress Notes (Signed)
  Subjective:    Patient ID: [name], female    DOB: 1936-10-02, 76 y.o.   MRN: [id number]  HPI  76 year old African American female with history of sarcoidosis and chronic hypertension for followup. Patient reports lower blood pressures especially around Christmas. Systolic readings were in the 86V. She lowered her dose of Benicar/hydrochlorothiazide on her own. Despite lowering her dose she still has low normal blood pressures. Her blood pressure today is without taking any antihypertensives.   Review of Systems Negative for chest pain  Past Medical History  Diagnosis Date  . Sarcoidosis   . Unspecified essential hypertension   . Allergic rhinitis, cause unspecified   . Esophageal reflux   . Vertigo     History   Social History  . Marital Status: Married    Spouse Name: N/A    Number of Children: N/A  . Years of Education: N/A   Occupational History  . Retired    Social History Main Topics  . Smoking status: Former Smoker -- 0.8 packs/day    Types: Cigarettes    Quit date: 12/16/1985  . Smokeless tobacco: Never Used  . Alcohol Use: No  . Drug Use: Not on file  . Sexually Active: Not on file   Other Topics Concern  . Not on file   Social History Narrative  . No narrative on file    Past Surgical History  Procedure Date  . Appendectomy   . Knee arthroscopy     Family History  Problem Relation Age of Onset  . Asthma Sister   . Bone cancer Mother   . Diabetes type II Sister     No Known Allergies  Current Outpatient Prescriptions on File Prior to Visit  Medication Sig Dispense Refill  . Cholecalciferol (VITAMIN D) 2000 UNITS CAPS Take 1 capsule by mouth daily.        . methotrexate (RHEUMATREX) 2.5 MG tablet Take 3 tablets (7.5 mg total) by mouth once a week. Take 3 tablets by mouth each week.   Caution:Chemotherapy. Protect from light.  12 tablet  3  . Multiple Vitamins-Minerals (CENTRUM SILVER PO) Take 1 tablet by mouth daily.           BP 132/82  Pulse 80  Temp(Src) 99.1 F (37.3 C) (Oral)  Wt 219 lb (99.338 kg)  SpO2 93%       Objective:   Physical Exam  Constitutional: She is oriented to person, place, and time. She appears well-developed and well-nourished.  Cardiovascular: Normal rate, regular rhythm and normal heart sounds.   Pulmonary/Chest: Effort normal. No respiratory distress. She has no wheezes.  Neurological: She is alert and oriented to person, place, and time.          Assessment & Plan:

## 2012-03-27 NOTE — Assessment & Plan Note (Addendum)
BP trending lower.  Further decrease benicar /hctz dose.  Use 1/2 of 20/12.5mg  once daily.  Reassess in 2 months BP: 132/82 mmHg

## 2012-07-08 ENCOUNTER — Ambulatory Visit: Payer: Medicare Other | Admitting: Pulmonary Disease

## 2012-07-22 ENCOUNTER — Other Ambulatory Visit: Payer: Self-pay | Admitting: Pulmonary Disease

## 2012-08-20 ENCOUNTER — Encounter: Payer: Self-pay | Admitting: Pulmonary Disease

## 2012-08-20 ENCOUNTER — Other Ambulatory Visit (INDEPENDENT_AMBULATORY_CARE_PROVIDER_SITE_OTHER): Payer: Medicare Other

## 2012-08-20 ENCOUNTER — Ambulatory Visit (INDEPENDENT_AMBULATORY_CARE_PROVIDER_SITE_OTHER): Payer: Medicare Other | Admitting: Pulmonary Disease

## 2012-08-20 VITALS — BP 102/64 | HR 97 | Temp 98.3°F | Ht 67.0 in | Wt 211.8 lb

## 2012-08-20 DIAGNOSIS — D869 Sarcoidosis, unspecified: Secondary | ICD-10-CM

## 2012-08-20 DIAGNOSIS — Z23 Encounter for immunization: Secondary | ICD-10-CM

## 2012-08-20 LAB — BASIC METABOLIC PANEL
BUN: 18 mg/dL (ref 6–23)
GFR: 71.4 mL/min (ref >60.00)
Potassium: 3.7 mEq/L (ref 3.5–5.1)
Sodium: 140 mEq/L (ref 135–145)

## 2012-08-20 LAB — CBC
MCHC: 32.4 g/dL (ref 30.0–36.0)
MCV: 94.8 fl (ref 78.0–100.0)
Platelets: 148 10*3/uL — ABNORMAL LOW (ref 150.0–400.0)
RDW: 15 % — ABNORMAL HIGH (ref 11.5–14.6)

## 2012-08-20 NOTE — Patient Instructions (Addendum)
Flu shot today Blood work - CBC, CMET We will send in 36 pills of methotrexate  (62m supply) next time Albuterol sample Follow-up 6 months with Rubye Oaks, NP

## 2012-08-20 NOTE — Progress Notes (Signed)
  Subjective:    Patient ID: [name], female    DOB: Dec 03, 1936, 76 y.o.   MRN: [id number]  HPI  76 year old for FU of sarcoidosis involving her skin and lungs.  Good response to 10 mg of prednisone , her dyspnea improved, and her skin lesions resolved.  Chest x-ray showed worsening of bilateral parenchymal scarring with chronic fibrotic changes. ACE level was 129.  >> Improvement in lung function & symptoms have suggested some reversible sarcoidosis with steroids. However limited by wt gain & hyperglycemia. On mtx since 4/12  Last PFTs sep '11 FVC 56%, TLC 57%, DLCO 30% - compare to FVC 63% in 10/09  4/12 started on methotrexate weekly , started on O2 act/sleep  Could not finish pulm rehab due to knee issues    08/20/2012 7m FU  Pt states SOB is same, no worse. Pt c/o dry cough that is worse at night and is requesting an rx for this.  Pt states her breathing has unchanged, wheezing some, dry cough at night  Compliant with methotrexate - no new skin lesions  Compliant with O2 when walking & sleep- desatn in office today, did not bring her O2  CXR 3/13- unchanged scarring   Review of Systems neg for any significant sore throat, dysphagia, itching, sneezing, nasal congestion or excess/ purulent secretions, fever, chills, sweats, unintended wt loss, pleuritic or exertional cp, hempoptysis, orthopnea pnd or change in chronic leg swelling. Also denies presyncope, palpitations, heartburn, abdominal pain, nausea, vomiting, diarrhea or change in bowel or urinary habits, dysuria,hematuria, rash, arthralgias, visual complaints, headache, numbness weakness or ataxia.     Objective:   Physical Exam  Gen. Pleasant, well-nourished, in no distress ENT - no lesions, no post nasal drip Neck: No JVD, no thyromegaly, no carotid bruits Lungs: no use of accessory muscles, no dullness to percussion, scattered rales, no rhonchi  Cardiovascular: Rhythm regular, heart sounds  normal, no murmurs or  gallops, no peripheral edema Musculoskeletal: No deformities, no cyanosis or clubbing        Assessment & Plan:

## 2012-08-21 LAB — HEPATIC FUNCTION PANEL
AST: 14 U/L (ref 0–37)
Bilirubin, Direct: 0.1 mg/dL (ref 0.0–0.3)
Total Bilirubin: 1 mg/dL (ref 0.3–1.2)

## 2012-10-22 ENCOUNTER — Telehealth: Payer: Self-pay | Admitting: Pulmonary Disease

## 2012-10-22 NOTE — Telephone Encounter (Signed)
This has been placed in RA look at area. Will forward to Dr. Vassie Loll so he is aware of this

## 2012-10-27 ENCOUNTER — Other Ambulatory Visit: Payer: Self-pay | Admitting: Pulmonary Disease

## 2012-10-29 NOTE — Telephone Encounter (Signed)
Pt would like to know if this is going to get approved & filled out & when she might be able to pick it up.  Pt can be reached at [phone].  Antionette Fairy'

## 2012-10-29 NOTE — Telephone Encounter (Signed)
Spoke with pt and advised that RA is not back in the office until 09/29/12 and this will have to wait until then. She states that she is needing this mailed to her once done, and wants to thank RA in advance. Please advise once scomplete, thanks!

## 2012-10-29 NOTE — Telephone Encounter (Signed)
Dr. Vassie Loll has not been in the office. This is in his look at for when he returns.

## 2012-10-29 NOTE — Telephone Encounter (Signed)
Please advise if this has been taken care of. Thanks.  

## 2012-10-30 NOTE — Telephone Encounter (Signed)
RA has filled out form. i have placed this in the mail for pt. I have confirmed mailing address. Nothing further was needed

## 2013-01-21 ENCOUNTER — Other Ambulatory Visit: Payer: Self-pay | Admitting: Pulmonary Disease

## 2013-01-25 ENCOUNTER — Ambulatory Visit (INDEPENDENT_AMBULATORY_CARE_PROVIDER_SITE_OTHER): Payer: Medicare Other | Admitting: Internal Medicine

## 2013-01-25 ENCOUNTER — Encounter: Payer: Self-pay | Admitting: Internal Medicine

## 2013-01-25 VITALS — BP 130/72 | HR 92 | Temp 99.2°F | Ht 67.0 in

## 2013-01-25 DIAGNOSIS — R0602 Shortness of breath: Secondary | ICD-10-CM

## 2013-01-25 DIAGNOSIS — D869 Sarcoidosis, unspecified: Secondary | ICD-10-CM

## 2013-01-25 DIAGNOSIS — J440 Chronic obstructive pulmonary disease with acute lower respiratory infection: Secondary | ICD-10-CM

## 2013-01-25 MED ORDER — METHYLPREDNISOLONE ACETATE 80 MG/ML IJ SUSP
80.0000 mg | Freq: Once | INTRAMUSCULAR | Status: AC
  Administered 2013-01-25: 80 mg via INTRAMUSCULAR

## 2013-01-25 MED ORDER — LEVALBUTEROL HCL 0.63 MG/3ML IN NEBU
0.6300 mg | INHALATION_SOLUTION | Freq: Once | RESPIRATORY_TRACT | Status: AC
  Administered 2013-01-25: 0.63 mg via RESPIRATORY_TRACT

## 2013-01-25 MED ORDER — DOXYCYCLINE HYCLATE 100 MG PO TABS: 100.0000 mg | ORAL_TABLET | Freq: Two times a day (BID) | ORAL | Status: DC

## 2013-01-25 MED ORDER — PREDNISONE 10 MG PO TABS: ORAL_TABLET | ORAL | Status: DC

## 2013-01-25 NOTE — Patient Instructions (Addendum)
You have acute bronchitis or flareup of sarcoidosis Take doxycycline 100mg  po twice daily x 5 days; take after meals and avoid sunlight. If you have nausea from this medication please call us Please take Take prednisone 40mg  once daily x 3 days, then 30mg  once daily x 3 days, then 20mg  once daily x 3 days, then prednisone 10mg  once daily  x 3 days and stop Continue methotrexate If symptoms get worse go to the emergency room Return for followup as previously scheduled

## 2013-01-25 NOTE — Progress Notes (Signed)
Subjective:    Patient ID: Micheal Likens, female    DOB: [birth year], 77 y.o.   MRN: [id number]  HPI 77 year old for FU of sarcoidosis involving her skin and lungs.  Good response to 10 mg of prednisone , her dyspnea improved, and her skin lesions resolved.  Chest x-ray showed worsening of bilateral parenchymal scarring with chronic fibrotic changes. ACE level was 129.  >> Improvement in lung function & symptoms have suggested some reversible sarcoidosis with steroids. However limited by wt gain & hyperglycemia. On mtx since 4/12  Last PFTs sep '11 FVC 56%, TLC 57%, DLCO 30% - compare to FVC 63% in 10/09  4/12 started on methotrexate weekly , started on O2 act/sleep  Could not finish pulm rehab due to knee issues    08/20/2012 51m FU  Pt states SOB is same, no worse. Pt c/o dry cough that is worse at night and is requesting an rx for this.  Pt states her breathing has unchanged, wheezing some, dry cough at night  Compliant with methotrexate - no new skin lesions  Compliant with O2 when walking & sleep- desatn in office today, did not bring her O2  CXR 3/13- unchanged scarring   Flu shot today  Blood work - CBC, CMET  We will send in 36 pills of methotrexate (63m supply) next time  Albuterol sample  Follow-up 6 months with Rubye Oaks, NP  OV 01/25/2013  Acute visit for patient with sarcoidosis. Normally sees Dr. Vassie Loll. Presents with  the daughter. One week ago she took her husband to the renal clinic and was exposed to number of sick contacts with respiratory infection. Then 4 days ago developed cough and 3 days ago started wheezing. Both symptoms are progressive. Patient labels that is mild but daughter labels as severe . Both refuse admission and do not feel symptoms are bad enough admission. This morning started coughing up green sputum small amount along with green nasal drainage. Associated with this was a fall from bed early in the morning while she was asleep but no injuries. She  is noted to be on methotrexate for sarcoidosis but not on prednisone. She is also on oxygen. They both maintain compliance with therapy. Negative symptoms include fever, edema, orthopnea ,and paroxysmal nocturnal dyspnea   Past, Family, Social reviewed: no change since last visit      Review of Systems  Constitutional: Negative for fever and unexpected weight change.  HENT: Positive for congestion. Negative for ear pain, nosebleeds, sore throat, rhinorrhea, sneezing, trouble swallowing, dental problem, postnasal drip and sinus pressure.   Eyes: Negative for redness and itching.  Respiratory: Positive for cough, shortness of breath and wheezing. Negative for chest tightness.   Cardiovascular: Negative for palpitations and leg swelling.  Gastrointestinal: Negative for nausea and vomiting.  Genitourinary: Negative for dysuria.  Musculoskeletal: Negative for joint swelling.  Skin: Negative for rash.  Neurological: Negative for headaches.  Hematological: Does not bruise/bleed easily.  Psychiatric/Behavioral: Negative for dysphoric mood. The patient is not nervous/anxious.    Current outpatient prescriptions:Cholecalciferol (VITAMIN D) 2000 UNITS CAPS, Take 1 capsule by mouth daily.  , Disp: , Rfl: ;  methotrexate (RHEUMATREX) 2.5 MG tablet, TAKE 3 TABLETS BY MOUTH ONCE A WEEK, Disp: 36 tablet, Rfl: 1;  Multiple Vitamins-Minerals (CENTRUM SILVER PO), Take 1 tablet by mouth daily.  , Disp: , Rfl:  olmesartan-hydrochlorothiazide (BENICAR HCT) 20-12.5 MG per tablet, Take 0.5 tablets by mouth daily., Disp: 90 tablet, Rfl: 1     Objective:  Physical Exam  Vitals reviewed. Constitutional: She is oriented to person, place, and time. She appears well-developed and well-nourished. No distress.  Elderly female looking chronically unwell seated on wheelchair  HENT:  Head: Normocephalic and atraumatic.  Right Ear: External ear normal.  Left Ear: External ear normal.  Mouth/Throat: Oropharynx is  clear and moist. No oropharyngeal exudate.  Nasal cannula oxygen on  Eyes: Conjunctivae and EOM are normal. Pupils are equal, round, and reactive to light. Right eye exhibits no discharge. Left eye exhibits no discharge. No scleral icterus.  Neck: Normal range of motion. Neck supple. No JVD present. No tracheal deviation present. No thyromegaly present.  Cardiovascular: Normal rate, regular rhythm, normal heart sounds and intact distal pulses.  Exam reveals no gallop and no friction rub.   No murmur heard. Pulmonary/Chest: Effort normal. No respiratory distress. She has wheezes. She has no rales. She exhibits no tenderness.  Abdominal: Soft. Bowel sounds are normal. She exhibits no distension and no mass. There is no tenderness. There is no rebound and no guarding.  Musculoskeletal: Normal range of motion. She exhibits no edema and no tenderness.  Lymphadenopathy:    She has no cervical adenopathy.  Neurological: She is alert and oriented to person, place, and time. She has normal reflexes. No cranial nerve deficit. She exhibits normal muscle tone. Coordination normal.  Skin: Skin is warm and dry. No rash noted. She is not diaphoretic. No erythema. No pallor.  Psychiatric: She has a normal mood and affect. Her behavior is normal. Judgment and thought content normal.   G      Assessment & Plan:

## 2013-01-26 DIAGNOSIS — J440 Chronic obstructive pulmonary disease with acute lower respiratory infection: Secondary | ICD-10-CM | POA: Insufficient documentation

## 2013-01-26 NOTE — Assessment & Plan Note (Signed)
She appears to have a flare up of her sarcoidosis in the form of an acute exacerbation with acute bronchitis. We will administer nebulizer and injection steroid in the office to tide her over from acute symptoms. She and her daughter do not feel that she needs to be admitted. I support the conclusion. I will discharge her from the office on 5 days of doxycycline and a prednisone taper over 8-12 days. They know if she gets worse the abdomen to the emergency room

## 2013-02-17 ENCOUNTER — Ambulatory Visit: Payer: Medicare Other | Admitting: Adult Health

## 2013-05-03 ENCOUNTER — Ambulatory Visit (INDEPENDENT_AMBULATORY_CARE_PROVIDER_SITE_OTHER): Payer: Medicare Other | Admitting: Adult Health

## 2013-05-03 ENCOUNTER — Encounter: Payer: Self-pay | Admitting: Adult Health

## 2013-05-03 VITALS — BP 136/84 | HR 75 | Temp 97.8°F | Ht 66.0 in | Wt 209.8 lb

## 2013-05-03 DIAGNOSIS — D869 Sarcoidosis, unspecified: Secondary | ICD-10-CM

## 2013-05-03 MED ORDER — PREDNISONE 10 MG PO TABS: ORAL_TABLET | ORAL | Status: DC

## 2013-05-03 NOTE — Progress Notes (Signed)
  Subjective:    Patient ID: [name], female    DOB: 1936-10-05, 77 y.o.   MRN: [id number]  HPI  77 year old with  sarcoidosis involving her skin and lungs. Good response to 10 mg of prednisone , her dyspnea improved, and her skin lesions resolved.  Chest x-ray showed worsening of bilateral parenchymal scarring with chronic fibrotic changes. ACE level was 129.  10/09 dyspnea persists, desatn on exertion, FVC 52 %, TLC 57%, DLCO 32 % >> decreased   Spirometry 10/09 >> FVC decreased to 52% from 63% in 4/09, DLCO 32%. Started methotrexate 5 mg q sunday with LFT monitoring   05/03/2013 Acute OV  Complains of increased SOB esp w/ exertion, prod cough with clear mucus, decreased energy x1 week.  Denies chest tightness, wheezing, f/c/s, recent travel or abx O2 tank not working on arrival to office . Placed on our O2 tank with nml sats on 2 l/m  No increased edema. No orthopnea.  Some rash on hands.  Continue on MTX 3 tab weekly.  CXR 03/2012 chronic changes, no acute process.     Review of Systems Constitutional:   No  weight loss, night sweats,  Fevers, chills, fatigue, or  lassitude.  HEENT:   No headaches,  Difficulty swallowing,  Tooth/dental problems, or  Sore throat,                No sneezing, itching, ear ache, nasal congestion, post nasal drip,   CV:  No chest pain,  Orthopnea, PND, swelling in lower extremities, anasarca, dizziness, palpitations, syncope.   GI  No heartburn, indigestion, abdominal pain, nausea, vomiting, diarrhea, change in bowel habits, loss of appetite, bloody stools.   Resp:  No excess mucus, no productive cough,  No non-productive cough,  No coughing up of blood.  No change in color of mucus.  No wheezing.  No chest wall deformity  Skin: +rash on hands   GU: no dysuria, change in color of urine, no urgency or frequency.  No flank pain, no hematuria   MS:  No joint pain or swelling.  No decreased range of motion.  No back pain.  Psych:  No  change in mood or affect. No depression or anxiety.  No memory loss.         Objective:   Physical Exam  Gen. Pleasant, well-nourished, in no distress, normal affect ENT - no lesions, no post nasal drip Neck: No JVD, no thyromegaly, no carotid bruits Lungs: no use of accessory muscles, no dullness to percussion, bibasilar crackles, no rhonchi  Cardiovascular: Rhythm regular, heart sounds  normal, no murmurs or gallops, no peripheral edema Abdomen: soft and non-tender, no hepatosplenomegaly, BS normal. Musculoskeletal: No deformities, no cyanosis or clubbing SKin - left hand w/  hyperpigmented sarcoid lesion         Assessment & Plan:

## 2013-05-03 NOTE — Patient Instructions (Addendum)
Begin prednisone  Taper  Prednisone 10mg  -4 tabs for 3 days, then 3 tabs for 3 days, 2 tabs for 3 days, then 1 tab for 3 days, then stop  Continue methotrexate Wear O2 2 l/m continuous flow only.  follow up Dr. Vassie Loll  In 2 months  Please contact office for sooner follow up if symptoms do not improve or worsen or seek emergency care

## 2013-05-03 NOTE — Assessment & Plan Note (Signed)
Flare   Plan  Begin prednisone  Taper  Prednisone 10mg  -4 tabs for 3 days, then 3 tabs for 3 days, 2 tabs for 3 days, then 1 tab for 3 days, then stop  Continue methotrexate Wear O2 2 l/m continuous flow only.  follow up Dr. Vassie Loll  In 2 months  Please contact office for sooner follow up if symptoms do not improve or worsen or seek emergency care

## 2013-06-25 ENCOUNTER — Encounter: Payer: Self-pay | Admitting: Pulmonary Disease

## 2013-06-25 ENCOUNTER — Ambulatory Visit (INDEPENDENT_AMBULATORY_CARE_PROVIDER_SITE_OTHER): Payer: Self-pay | Admitting: Pulmonary Disease

## 2013-06-25 VITALS — BP 138/80 | HR 84 | Temp 97.6°F | Ht 66.0 in | Wt 204.8 lb

## 2013-06-25 DIAGNOSIS — D869 Sarcoidosis, unspecified: Secondary | ICD-10-CM

## 2013-06-25 DIAGNOSIS — R0902 Hypoxemia: Secondary | ICD-10-CM

## 2013-06-25 MED ORDER — PREDNISONE 5 MG PO TABS: 5.0000 mg | ORAL_TABLET | Freq: Every day | ORAL | Status: DC

## 2013-06-25 MED ORDER — METHOTREXATE 2.5 MG PO TABS: ORAL_TABLET | ORAL | Status: DC

## 2013-06-25 NOTE — Assessment & Plan Note (Signed)
We will ask DME to recheck her portable o2, she seems to need it 24h now

## 2013-06-25 NOTE — Progress Notes (Signed)
  Subjective:    Patient ID: [name], female    DOB: 02/09/1936, 77 y.o.   MRN: [id number]  HPI  77 year old with sarcoidosis involving her skin and lungs. Good response to 10 mg of prednisone , her dyspnea improved, and her skin lesions resolved.  Chest x-ray showed worsening of bilateral parenchymal scarring with chronic fibrotic changes. ACE level was 129.  10/09 dyspnea persists, desatn on exertion, FVC 52 %, TLC 57%, DLCO 32 % >> decreased  Spirometry 10/09 >> FVC decreased to 52% from 63% in 4/09, DLCO 32%. Started methotrexate 5 mg q sunday with LFT monitoring   06/25/2013  05/03/2013 Acute OV >> pred taper worked very well  Today, Pt rerports breathing has unchanged. c/o dry cough. no wheezing, no chest tx. pt entered exam room RA 79%.  Complains of  decreased energy x1 week.  Denies chest tightness, wheezing, f/c/s, recent travel or abx  O2 tank not working on arrival to office . Placed on our O2 tank with nml sats on 2 l/m  No increased edema. No orthopnea.  No skin lesions.  Continues on MTX 3 tab weekly.  CXR 03/2012 chronic changes, no acute process.   Past Medical History  Diagnosis Date  . Sarcoidosis   . Unspecified essential hypertension   . Allergic rhinitis, cause unspecified   . Esophageal reflux   . Vertigo      Review of Systems neg for any significant sore throat, dysphagia, itching, sneezing, nasal congestion or excess/ purulent secretions, fever, chills, sweats, unintended wt loss, pleuritic or exertional cp, hempoptysis, orthopnea pnd or change in chronic leg swelling. Also denies presyncope, palpitations, heartburn, abdominal pain, nausea, vomiting, diarrhea or change in bowel or urinary habits, dysuria,hematuria, rash, arthralgias, visual complaints, headache, numbness weakness or ataxia.     Objective:   Physical Exam  Gen. Pleasant, well-nourished, in no distress ENT - no lesions, no post nasal drip Neck: No JVD, no thyromegaly, no  carotid bruits Lungs: no use of accessory muscles, no dullness to percussion, bibasal rales, no rhonchi  Cardiovascular: Rhythm regular, heart sounds  normal, no murmurs or gallops, no peripheral edema Musculoskeletal: No deformities, no cyanosis or clubbing        Assessment & Plan:

## 2013-06-25 NOTE — Assessment & Plan Note (Signed)
Increase methotrexate to 4 tabs every week (10 mg) Start prednisone 5mg  daily x 1 month - will then cut down to 5mg  m/w/f CXr today

## 2013-06-25 NOTE — Patient Instructions (Addendum)
Increase methotrexate to 4 tabs every week (10 mg) Start prednisone 5mg  daily CXr today

## 2013-06-28 ENCOUNTER — Telehealth: Payer: Self-pay | Admitting: Internal Medicine

## 2013-06-28 NOTE — Telephone Encounter (Signed)
Tried calling patient, no answer.  Voice mail stated it was full.  Will try calling back soon if call is not returned.   Thanks!

## 2013-06-28 NOTE — Telephone Encounter (Signed)
Message copied by Newell Coral on Mon Jun 28, 2013  1:20 PM ------      Message from: Newt Lukes      Created: Sat Jun 26, 2013 11:16 AM       Kathy Breach - no problem.  Thanks for asking. I will have my office call and reach out to her with appointment            Eber Jones or Truddie Hidden - please help schedule this patient with me at a time that is convenient  for her (no need to wait for my next "new" pt as she is already established in the EMR with our division)            Thanks to you all      Vikki Ports            ----- Message -----         From: Oretha Milch, MD         Sent: 06/25/2013  12:09 PM           To: Newt Lukes, MD            Vikki Ports,      Would you be willing to pick up this nice pt that I see for sarcoid?      She sees Alfredo Batty but cannot keep with his hours anymore.      Would appreciate it,            [place]       ------

## 2013-07-01 NOTE — Telephone Encounter (Signed)
Pt is scheduled with Dr. Felicity Coyer on Aug 27

## 2013-07-28 ENCOUNTER — Ambulatory Visit (INDEPENDENT_AMBULATORY_CARE_PROVIDER_SITE_OTHER)
Admission: RE | Admit: 2013-07-28 | Discharge: 2013-07-28 | Disposition: A | Discharge status: Home / Self Care | Payer: Medicare Other | Source: Ambulatory Visit | Attending: Adult Health | Admitting: Adult Health

## 2013-07-28 ENCOUNTER — Encounter: Payer: Self-pay | Admitting: Adult Health

## 2013-07-28 ENCOUNTER — Ambulatory Visit (INDEPENDENT_AMBULATORY_CARE_PROVIDER_SITE_OTHER): Payer: Medicare Other | Admitting: Adult Health

## 2013-07-28 VITALS — BP 110/76 | HR 108 | Temp 97.6°F | Ht 64.5 in | Wt 194.4 lb

## 2013-07-28 DIAGNOSIS — D869 Sarcoidosis, unspecified: Secondary | ICD-10-CM

## 2013-07-28 NOTE — Progress Notes (Signed)
  Subjective:    Patient ID: [name], female    DOB: 05/29/1936, 77 y.o.   MRN: [id number]  HPI  77 year old with  sarcoidosis involving her skin and lungs. Good response to 10 mg of prednisone , her dyspnea improved, and her skin lesions resolved.  Chest x-ray showed worsening of bilateral parenchymal scarring with chronic fibrotic changes. ACE level was 129.  10/09 dyspnea persists, desatn on exertion, FVC 52 %, TLC 57%, DLCO 32 % >> decreased   Spirometry 10/09 >> FVC decreased to 52% from 63% in 4/09, DLCO 32%. Started methotrexate 5 mg q sunday with LFT monitoring  05/03/2013 Acute OV  Complains of increased SOB esp w/ exertion, prod cough with clear mucus, decreased energy x1 week.  Denies chest tightness, wheezing, f/c/s, recent travel or abx O2 tank not working on arrival to office . Placed on our O2 tank with nml sats on 2 l/m  No increased edema. No orthopnea.  Some rash on hands.  Continue on MTX 3 tab weekly.  CXR 03/2012 chronic changes, no acute process.  >>pred taper   07/28/2013 Follow up  Returns for follow up for Sarcoid  Reports breathing is doing better today then perviously. Would like to discuss getting a portable concentrator.Finished pred Sunday 8/3.  Denies SOB, wheezing, cough and tightness in chest. MTX was increased last month and pred 5mg  daily started  Needs new rx for prednisone.  No flare of cough , dyspnea or rash.    Review of Systems Constitutional:   No  weight loss, night sweats,  Fevers, chills, fatigue, or  lassitude.  HEENT:   No headaches,  Difficulty swallowing,  Tooth/dental problems, or  Sore throat,                No sneezing, itching, ear ache, nasal congestion, post nasal drip,   CV:  No chest pain,  Orthopnea, PND, swelling in lower extremities, anasarca, dizziness, palpitations, syncope.   GI  No heartburn, indigestion, abdominal pain, nausea, vomiting, diarrhea, change in bowel habits, loss of appetite, bloody stools.    Resp:  No excess mucus, no productive cough,  No non-productive cough,  No coughing up of blood.  No change in color of mucus.  No wheezing.  No chest wall deformity  Skin: +rash on hands   GU: no dysuria, change in color of urine, no urgency or frequency.  No flank pain, no hematuria   MS:  No joint pain or swelling.  No decreased range of motion.  No back pain.  Psych:  No change in mood or affect. No depression or anxiety.  No memory loss.         Objective:   Physical Exam  Gen. Pleasant, well-nourished, in no distress, normal affect ENT - no lesions, no post nasal drip Neck: No JVD, no thyromegaly, no carotid bruits Lungs: no use of accessory muscles, no dullness to percussion, bibasilar crackles, no rhonchi  Cardiovascular: Rhythm regular, heart sounds  normal, no murmurs or gallops, no peripheral edema Abdomen: soft and non-tender, no hepatosplenomegaly, BS normal. Musculoskeletal: No deformities, no cyanosis or clubbing SKin - left hand w/  hyperpigmented sarcoid lesion         Assessment & Plan:

## 2013-07-28 NOTE — Patient Instructions (Addendum)
Continue on prednisone 5mg  daily for 3 weeks and then begin 1/2 tab daily until seen back in office  Chest xray today .  follow up Dr. Vassie Loll  In 4-6 weeks and As needed

## 2013-07-29 MED ORDER — PREDNISONE 5 MG PO TABS: 5.0000 mg | ORAL_TABLET | Freq: Every day | ORAL | Status: DC

## 2013-07-29 NOTE — Progress Notes (Signed)
Quick Note:  Called spoke with patient, advised of cxr results / recs as stated by TP. Pt verbalized her understanding and denied any questions. ______ 

## 2013-07-29 NOTE — Assessment & Plan Note (Signed)
Recent flare now improving  Same MTX dose   Plan  Continue on prednisone 5mg  daily for 3 weeks and then begin 1/2 tab daily until seen back in office  Chest xray today .  follow up Dr. Vassie Loll  In 4-6 weeks and As needed

## 2013-08-11 ENCOUNTER — Ambulatory Visit (INDEPENDENT_AMBULATORY_CARE_PROVIDER_SITE_OTHER): Payer: Medicare Other | Admitting: Internal Medicine

## 2013-08-11 ENCOUNTER — Other Ambulatory Visit (INDEPENDENT_AMBULATORY_CARE_PROVIDER_SITE_OTHER): Payer: Medicare Other

## 2013-08-11 ENCOUNTER — Encounter: Payer: Self-pay | Admitting: Internal Medicine

## 2013-08-11 VITALS — BP 132/80 | HR 71 | Temp 98.3°F | Ht 66.0 in | Wt 194.1 lb

## 2013-08-11 DIAGNOSIS — I10 Essential (primary) hypertension: Secondary | ICD-10-CM

## 2013-08-11 DIAGNOSIS — J209 Acute bronchitis, unspecified: Secondary | ICD-10-CM

## 2013-08-11 DIAGNOSIS — Z0000 Encounter for general adult medical examination without abnormal findings: Secondary | ICD-10-CM

## 2013-08-11 DIAGNOSIS — D869 Sarcoidosis, unspecified: Secondary | ICD-10-CM

## 2013-08-11 DIAGNOSIS — M1990 Unspecified osteoarthritis, unspecified site: Secondary | ICD-10-CM

## 2013-08-11 DIAGNOSIS — R7309 Other abnormal glucose: Secondary | ICD-10-CM

## 2013-08-11 LAB — CBC WITH DIFFERENTIAL/PLATELET
Basophils Absolute: 0 10*3/uL (ref 0.0–0.1)
Basophils Relative: 0.6 % (ref 0.0–3.0)
Eosinophils Absolute: 0.3 10*3/uL (ref 0.0–0.7)
HCT: 42.5 % (ref 36.0–46.0)
Hemoglobin: 14.4 g/dL (ref 12.0–15.0)
Lymphocytes Relative: 19.6 % (ref 12.0–46.0)
Lymphs Abs: 1 10*3/uL (ref 0.7–4.0)
MCHC: 33.8 g/dL (ref 30.0–36.0)
MCV: 91.2 fl (ref 78.0–100.0)
Monocytes Absolute: 0.4 10*3/uL (ref 0.1–1.0)
Neutro Abs: 3.6 10*3/uL (ref 1.4–7.7)
RBC: 4.66 Mil/uL (ref 3.87–5.11)
RDW: 16.2 % — ABNORMAL HIGH (ref 11.5–14.6)

## 2013-08-11 MED ORDER — ACETAMINOPHEN 500 MG PO TABS: 1000.0000 mg | ORAL_TABLET | Freq: Two times a day (BID) | ORAL | Status: DC

## 2013-08-11 MED ORDER — LOSARTAN POTASSIUM-HCTZ 50-12.5 MG PO TABS: 1.0000 | ORAL_TABLET | Freq: Every day | ORAL | Status: DC

## 2013-08-11 MED ORDER — AZITHROMYCIN 250 MG PO TABS: ORAL_TABLET | ORAL | Status: DC

## 2013-08-11 MED ORDER — FOLIC ACID 1 MG PO TABS: 1.0000 mg | ORAL_TABLET | Freq: Every day | ORAL | Status: DC

## 2013-08-11 MED ORDER — TRAMADOL HCL 50 MG PO TABS: 50.0000 mg | ORAL_TABLET | Freq: Three times a day (TID) | ORAL | Status: DC | PRN

## 2013-08-11 NOTE — Assessment & Plan Note (Addendum)
Following with pulm - dx 2004 On MTX and pred - chronic hypoxic resp failure related to same Add folic acid now for chronic MTX

## 2013-08-11 NOTE — Assessment & Plan Note (Signed)
generalized symptoms - knees, hands History total knee replacement right x2, unsuccessful with continued pain weightbearing activity Off OTC NSAIDs at recommendation of pulm Recommend scheduled Tylenol twice a day and tramadol as needed for acute pain flares - rx done

## 2013-08-11 NOTE — Assessment & Plan Note (Signed)
Diet controlled, exacerbated by freq pred for pulm dz Does not check cbgs Check a1c now to monitor same Lab Results  Component Value Date   HGBA1C 6.1* 02/13/2011

## 2013-08-11 NOTE — Patient Instructions (Signed)
It was good to see you today. We have reviewed your prior records including labs and tests today Health Maintenance reviewed - all recommended immunizations and age-appropriate screenings are up-to-date. Test(s) ordered today. Your results will be released to MyChart (or called to you) after review, usually within 72hours after test completion. If any changes need to be made, you will be notified at that same time. Medications reviewed and updated Change Benicar to generic losartan with diuretic and take once daily for blood pressure control Begin Tylenol 2 tablets twice daily for arthritis and use tramadol as needed for additional arthritic pain Z-Pak antibiotics over next 5 days for bronchitis symptoms Begin folic acid pill once daily to help balance effects of methotrexate Your prescription(s) have been submitted to your pharmacy. Please take as directed and contact our office if you believe you are having problem(s) with the medication(s). Please schedule followup in 6 months, call sooner if problems.

## 2013-08-11 NOTE — Progress Notes (Signed)
Subjective:    Patient ID: [name], female    DOB: 1936-08-25, 77 y.o.   MRN: [id number]  HPI  New patient to me, known to our division. Transfer from primary care provider Dr. Artist Pais Works with other specialists our clinic, namely pulmonary Dr. Vassie Loll Reviewed chronic medical issues today:  Sarcoid, associated with chronic hypoxic respiratory failure. On methotrexate and currently tapering prednisone for management of same. Reports one week of increasing cough and chest congestion  Hypertension - the patient reports compliance with medication(s) as prescribed. Denies adverse side effects. -?generic alternative for cost savings  Also here for medicare wellness  Diet: heart healthy Physical activity: sedentary Depression/mood screen: negative Hearing: intact to whispered voice Visual acuity: grossly normal, performs annual eye exam  ADLs: capable Fall risk: none Home safety: good Cognitive evaluation: intact to orientation, naming, recall and repetition EOL planning: adv directives, full code/ I agree  I have personally reviewed and have noted 1. The patient's medical and social history 2. Their use of alcohol, tobacco or illicit drugs 3. Their current medications and supplements 4. The patient's functional ability including ADL's, fall risks, home safety risks and hearing or visual impairment. 5. Diet and physical activities 6. Evidence for depression or mood disorders   Past Medical History  Diagnosis Date  . Sarcoidosis     hypoxic resp failure, follows with pulm for same  . Hypertension   . Allergic rhinitis, cause unspecified   . Esophageal reflux   . Vertigo   . Unspecified vitamin D deficiency   . OSTEOPENIA   . DIABETES MELLITUS, TYPE II, BORDERLINE    Family History  Problem Relation Age of Onset  . Asthma Sister   . Bone cancer Mother   . Diabetes type II Sister    History  Substance Use Topics  . Smoking status: Former Smoker -- 0.80 packs/day  for 20 years    Types: Cigarettes    Quit date: 12/16/1985  . Smokeless tobacco: Never Used  . Alcohol Use: No    Review of Systems Constitutional: Negative for fever or weight change.  Respiratory: Negative for changes in chronic shortness of breath.   Cardiovascular: Negative for chest pain or palpitations.  Gastrointestinal: Negative for abdominal pain, no bowel changes.  Musculoskeletal: Negative for gait problem or joint swelling.  Skin: Negative for rash.  Neurological: Negative for dizziness or headache.  No other specific complaints in a complete review of systems (except as listed in HPI above).     Objective:   Physical Exam BP 152/90  Pulse 71  Temp(Src) 98.3 F (36.8 C) (Oral)  Ht 5\' 6"  (1.676 m)  Wt 194 lb 1.9 oz (88.052 kg)  BMI 31.35 kg/m2  SpO2 91% Wt Readings from Last 3 Encounters:  08/11/13 194 lb 1.9 oz (88.052 kg)  07/28/13 194 lb 6.4 oz (88.179 kg)  06/25/13 204 lb 12.8 oz (92.897 kg)   Constitutional: She is overweight, but appears well-developed and well-nourished. No distress.  HENT: Head: Normocephalic and atraumatic. Ears: B TMs ok, no erythema or effusion; Nose: Nose normal. Mouth/Throat: Oropharynx is clear and moist. No oropharyngeal exudate.  Eyes: Conjunctivae and EOM are normal. Pupils are equal, round, and reactive to light. No scleral icterus.  Neck: Normal range of motion. Neck supple. No JVD present. No thyromegaly present.  Cardiovascular: Normal rate, regular rhythm and normal heart sounds.  No murmur heard. No BLE edema. Pulmonary/Chest: Effort normal and breath sounds Rhonchi, right greater than left base. No respiratory  distress. She has no wheezes.  Abdominal: Soft. Bowel sounds are normal. She exhibits no distension. There is no tenderness. no masses Musculoskeletal: right total knee replacement surgical changes, chronically enlarged but no effusion. CL arthritic changes bilateral hands, especially PIPs. Normal range of motion, no  joint effusions. Neurological: She is alert and oriented to person, place, and time. No cranial nerve deficit. Coordination, balance, strength, speech and gait are normal.  Skin: Skin is warm and dry. No rash noted. No erythema.  Psychiatric: She has a normal mood and affect. Her behavior is normal. Judgment and thought content normal.   Lab Results  Component Value Date   WBC 5.5 08/20/2012   HGB 14.3 08/20/2012   HCT 44.2 08/20/2012   PLT 148.0* 08/20/2012   GLUCOSE 99 08/20/2012   CHOL 172 02/13/2011   TRIG 73 02/13/2011   HDL 51 02/13/2011   LDLCALC 106* 02/13/2011   ALT 9 08/20/2012   AST 14 08/20/2012   NA 140 08/20/2012   K 3.7 08/20/2012   CL 104 08/20/2012   CREATININE 1.0 08/20/2012   BUN 18 08/20/2012   CO2 30 08/20/2012   TSH 1.323 02/13/2011   HGBA1C 6.1* 02/13/2011       Assessment & Plan:   AWV/v70.0 - Today patient counseled on age appropriate routine health concerns for screening and prevention, each reviewed and up to date or declined. Immunizations reviewed and up to date or declined. Labs ordered and reviewed. Risk factors for depression reviewed and negative. Hearing function and visual acuity are intact. ADLs screened and addressed as needed. Functional ability and level of safety reviewed and appropriate. Education, counseling and referrals performed based on assessed risks today. Patient provided with a copy of personalized plan for preventive services.  Acute bronchitis in setting of chronic hypoxic respiratory failure and sarcoid -treatment empiric Z-Pak. Advised followup with pulmonary symptoms unimproved, sooner if worse  Also see problem list. Medications and labs reviewed today.

## 2013-08-11 NOTE — Assessment & Plan Note (Addendum)
BP Readings from Last 3 Encounters:  08/11/13 132/80  07/28/13 110/76  06/25/13 138/80   On ARB-HCT, change to generic for cost The current medical regimen is effective;  continue present plan and medications.

## 2013-08-12 LAB — TSH: TSH: 0.78 u[IU]/mL (ref 0.35–5.50)

## 2013-08-12 LAB — BASIC METABOLIC PANEL
BUN: 8 mg/dL (ref 6–23)
Creatinine, Ser: 0.9 mg/dL (ref 0.4–1.2)
GFR: 81.7 mL/min (ref >60.00)
Glucose, Bld: 87 mg/dL (ref 70–99)
Potassium: 4.7 mEq/L (ref 3.5–5.1)

## 2013-08-12 LAB — LIPID PANEL
Cholesterol: 131 mg/dL (ref 0–200)
VLDL: 13 mg/dL (ref 0.0–40.0)

## 2013-08-12 LAB — HEPATIC FUNCTION PANEL
ALT: 15 U/L (ref 0–35)
Alkaline Phosphatase: 40 U/L (ref 39–117)
Bilirubin, Direct: 0.3 mg/dL (ref 0.0–0.3)
Total Bilirubin: 1.4 mg/dL — ABNORMAL HIGH (ref 0.3–1.2)

## 2013-09-17 ENCOUNTER — Ambulatory Visit (INDEPENDENT_AMBULATORY_CARE_PROVIDER_SITE_OTHER): Payer: Medicare Other | Admitting: Pulmonary Disease

## 2013-09-17 ENCOUNTER — Encounter: Payer: Self-pay | Admitting: Pulmonary Disease

## 2013-09-17 VITALS — BP 112/76 | HR 93 | Ht 66.0 in | Wt 190.8 lb

## 2013-09-17 DIAGNOSIS — Z23 Encounter for immunization: Secondary | ICD-10-CM

## 2013-09-17 DIAGNOSIS — D869 Sarcoidosis, unspecified: Secondary | ICD-10-CM

## 2013-09-17 MED ORDER — METHOTREXATE 2.5 MG PO TABS: ORAL_TABLET | ORAL | Status: DC

## 2013-09-17 NOTE — Assessment & Plan Note (Signed)
FLu shot Refills on methotrexate will be sent Stay on 5mg  prednisone daily for October Starting Nov 1 st, go down to 1 tab (5mg  ) prednisone on Mon/ wed / Fri only We will place request for portable oxygne tank

## 2013-09-17 NOTE — Patient Instructions (Addendum)
FLu shot Refills on methotrexate will be sent Stay on 5mg prednisone daily for October Starting Nov 1 st, go down to 1 tab (5mg ) prednisone on Mon/ wed / Fri only We will place request for portable oxygne tank 

## 2013-09-17 NOTE — Progress Notes (Signed)
  Subjective:    Patient ID: [name], female    DOB: 05-16-1936, 77 y.o.   MRN: [id number]  HPI  77 year old with sarcoidosis involving her skin and lungs. Good response to 10 mg of prednisone , her dyspnea improved, and her skin lesions resolved.  Chest x-ray showed worsening of bilateral parenchymal scarring with chronic fibrotic changes. ACE level was 129.  10/09 dyspnea persists, desatn on exertion, FVC 52 %, TLC 57%, DLCO 32 % >> decreased  Spirometry 10/09 >> FVC decreased to 52% from 63% in 4/09, DLCO 32%. Started methotrexate 5 mg q sunday with LFT monitoring  05/03/2013 Acute OV  Complains of increased SOB esp w/ exertion, prod cough with clear mucus, decreased energy x1 week.  Denies chest tightness, wheezing, f/c/s, recent travel or abx  O2 tank not working on arrival to office . Placed on our O2 tank with nml sats on 2 l/m  No increased edema. No orthopnea.  Some rash on hands.  Continue on MTX 3 tab weekly.  CXR 03/2012 chronic changes, no acute process.  >>pred taper   07/28/2013 Follow up  Returns for follow up for Sarcoid  Reports breathing is doing better today then perviously. Would like to discuss getting a portable concentrator.Finished pred Sunday 8/3.  Denies SOB, wheezing, cough and tightness in chest.  MTX was increased last month and pred 5mg  daily started  Needs new rx for prednisone.  No flare of cough , dyspnea or rash.    09/17/2013  Pt reports her breathing is better than last OV. Has a dry cough at night. denies any PND, no chest tx, no wheezing. Pt entered exam room 86% RA. After sitting x 5 minutes she recovered to 90%. Pt uses O2 at home PRN.  CXR 07/2013 worsening adenopathy , scarring not definitely changed from 03/12/2012 Tramadol works well for arthritis    Review of Systems neg for any significant sore throat, dysphagia, itching, sneezing, nasal congestion or excess/ purulent secretions, fever, chills, sweats, unintended wt loss,  pleuritic or exertional cp, hempoptysis, orthopnea pnd or change in chronic leg swelling. Also denies presyncope, palpitations, heartburn, abdominal pain, nausea, vomiting, diarrhea or change in bowel or urinary habits, dysuria,hematuria, rash, arthralgias, visual complaints, headache, numbness weakness or ataxia.     Objective:   Physical Exam  Gen. Pleasant, well-nourished, in no distress ENT - no lesions, no post nasal drip Neck: No JVD, no thyromegaly, no carotid bruits Lungs: no use of accessory muscles, no dullness to percussion, bibasal rales, no rhonchi  Cardiovascular: Rhythm regular, heart sounds  normal, no murmurs or gallops, no peripheral edema Musculoskeletal: No deformities, no cyanosis or clubbing        Assessment & Plan:

## 2013-09-19 ENCOUNTER — Other Ambulatory Visit: Payer: Self-pay | Admitting: Internal Medicine

## 2013-09-20 ENCOUNTER — Other Ambulatory Visit: Payer: Self-pay | Admitting: Pulmonary Disease

## 2013-09-20 NOTE — Telephone Encounter (Signed)
Faxed script back to [facility].../lmb 

## 2013-11-26 ENCOUNTER — Other Ambulatory Visit: Payer: Self-pay | Admitting: Pulmonary Disease

## 2013-12-07 ENCOUNTER — Ambulatory Visit (INDEPENDENT_AMBULATORY_CARE_PROVIDER_SITE_OTHER): Payer: Medicare Other | Admitting: Internal Medicine

## 2013-12-07 ENCOUNTER — Encounter: Payer: Self-pay | Admitting: Internal Medicine

## 2013-12-07 VITALS — BP 162/98 | HR 74 | Temp 97.2°F | Wt 192.4 lb

## 2013-12-07 DIAGNOSIS — D869 Sarcoidosis, unspecified: Secondary | ICD-10-CM

## 2013-12-07 DIAGNOSIS — F4321 Adjustment disorder with depressed mood: Secondary | ICD-10-CM

## 2013-12-07 DIAGNOSIS — I10 Essential (primary) hypertension: Secondary | ICD-10-CM

## 2013-12-07 MED ORDER — HYDROCHLOROTHIAZIDE 12.5 MG PO CAPS: 12.5000 mg | ORAL_CAPSULE | Freq: Every day | ORAL | Status: DC | PRN

## 2013-12-07 MED ORDER — TRAMADOL HCL 50 MG PO TABS: 50.0000 mg | ORAL_TABLET | Freq: Three times a day (TID) | ORAL | Status: DC | PRN

## 2013-12-07 MED ORDER — LOSARTAN POTASSIUM 25 MG PO TABS: 25.0000 mg | ORAL_TABLET | Freq: Every day | ORAL | Status: AC

## 2013-12-07 NOTE — Progress Notes (Signed)
   Subjective:    Patient ID: [name], female    DOB: September 10, 1936, 77 y.o.   MRN: [id number]  HPI  Here for follow up - reviewed chronic medical issues and interval medical events  Sarcoid, associated with chronic hypoxic respiratory failure. On methotrexate and prednisone for management of same. Denies current flares of cough and chest congestion  Hypertension - the patient reports compliance with medication(s) as prescribed. Report symptomatic hypotension if taken every day. ?alternative medication  Past Medical History  Diagnosis Date  . Sarcoidosis     hypoxic resp failure, follows with pulm for same  . Hypertension   . Allergic rhinitis, cause unspecified   . Esophageal reflux   . Vertigo   . Unspecified vitamin D deficiency   . OSTEOPENIA   . DIABETES MELLITUS, TYPE II, BORDERLINE   . Osteoarthritis     s/p TKR x 2 right    Review of Systems  Constitutional: Negative for fever and fatigue.  Respiratory: Positive for shortness of breath (chronic). Negative for cough.   Cardiovascular: Negative for chest pain and leg swelling.  Neurological: Negative for dizziness and headaches.  Psychiatric/Behavioral: Positive for sleep disturbance and dysphoric mood (spouse passed 12-06-13). Negative for suicidal ideas and self-injury.       Objective:   Physical Exam  BP 162/98  Pulse 74  Temp(Src) 97.2 F (36.2 C) (Oral)  Wt 192 lb 6.4 oz (87.272 kg)  SpO2 90% Wt Readings from Last 3 Encounters:  12/07/13 192 lb 6.4 oz (87.272 kg)  09/17/13 190 lb 12.8 oz (86.546 kg)  08/11/13 194 lb 1.9 oz (88.052 kg)   Constitutional: She appears well-developed and well-nourished. No distress. Wearing  O2  Neck: Normal range of motion. Neck supple. No JVD present. No thyromegaly present.  Cardiovascular: Normal rate, regular rhythm and normal heart sounds.  No murmur heard. No BLE edema. Pulmonary/Chest: Effort normal with diminished breath sounds. She has no wheezes but R>L  base crackles.   Psychiatric: She has an appropriate dysphoric, occ tearful mood and affect. Her behavior is normal. Judgment and thought content normal.   Lab Results  Component Value Date   WBC 5.3 08/11/2013   HGB 14.4 08/11/2013   HCT 42.5 08/11/2013   PLT 291.0 08/11/2013   GLUCOSE 87 08/11/2013   CHOL 131 08/11/2013   TRIG 65.0 08/11/2013   HDL 42.50 08/11/2013   LDLCALC 76 08/11/2013   ALT 15 08/11/2013   AST 18 08/11/2013   NA 139 08/11/2013   K 4.7 08/11/2013   CL 101 08/11/2013   CREATININE 0.9 08/11/2013   BUN 8 08/11/2013   CO2 28 08/11/2013   TSH 0.78 08/11/2013   HGBA1C 6.1 08/11/2013      Assessment & Plan:   See problem list. Medications and labs reviewed today.  Grief reaction - support offered -patient declines need for medications or counseling at this time following spouse's unexpected death 12-06-13

## 2013-12-07 NOTE — Progress Notes (Signed)
Pre-visit discussion using our clinic review tool. No additional management support is needed unless otherwise documented below in the visit note.  

## 2013-12-07 NOTE — Patient Instructions (Addendum)
It was good to see you today.  We have reviewed your prior records including labs and tests today  I am sorry for your loss - please let me know if there is anything medically i can do to help you   Medications reviewed and updated Change bp medications to loartan 25mg  daily and hctz daily if/as needed No other changes recommended   Your prescription(s) have been submitted to your pharmacy. Please take as directed and contact our office if you believe you are having problem(s) with the medication(s).  Hypertension Hypertension is another name for high blood pressure. High blood pressure may mean that your heart needs to work harder to pump blood. Blood pressure consists of two numbers, which includes a higher number over a lower number (example: 110/72). HOME CARE   Make lifestyle changes as told by your doctor. This may include weight loss and exercise.  Take your blood pressure medicine every day.  Limit how much salt you use.  Stop smoking if you smoke.  Do not use drugs.  Talk to your doctor if you are using decongestants or birth control pills. These medicines might make blood pressure higher.  Females should not drink more than 1 alcoholic drink per day. Males should not drink more than 2 alcoholic drinks per day.  See your doctor as told. GET HELP RIGHT AWAY IF:   You have a blood pressure reading with a top number of 180 or higher.  You get a very bad headache.  You get blurred or changing vision.  You feel confused.  You feel weak, numb, or faint.  You get chest or belly (abdominal) pain.  You throw up (vomit).  You cannot breathe very well. MAKE SURE YOU:   Understand these instructions.  Will watch your condition.  Will get help right away if you are not doing well or get worse. Document Released: 05/20/2008 Document Revised: 02/24/2012 Document Reviewed: 05/20/2008 [facility] Patient Information 2014 [place], [place].

## 2013-12-07 NOTE — Assessment & Plan Note (Signed)
Following with pulm - dx 2004 On MTX and tapering pred -  chronic hypoxic resp failure related to same continue folic acid daily with chronic MTX

## 2013-12-07 NOTE — Assessment & Plan Note (Signed)
BP Readings from Last 3 Encounters:  12/07/13 162/98  09/17/13 112/76  08/11/13 132/80   On ARB-HCT, ?overtreated if taken everyday Break into subcomponent parts and reduced ARB dose: Losartan 25 mg daily with HCTZ 12-1/2 mg daily if needed -new erx done Pt will monitor at home and call if symptoms or problems

## 2013-12-14 ENCOUNTER — Inpatient Hospital Stay (HOSPITAL_COMMUNITY)
Admission: EM | Admit: 2013-12-14 | Discharge: 2013-12-16 | DRG: 287 | Disposition: A | Discharge status: Home / Self Care | Payer: Medicare Other | Attending: Cardiology | Admitting: Cardiology

## 2013-12-14 ENCOUNTER — Encounter (HOSPITAL_COMMUNITY): Payer: Self-pay | Admitting: Emergency Medicine

## 2013-12-14 DIAGNOSIS — Z79899 Other long term (current) drug therapy: Secondary | ICD-10-CM

## 2013-12-14 DIAGNOSIS — R7309 Other abnormal glucose: Secondary | ICD-10-CM | POA: Diagnosis present

## 2013-12-14 DIAGNOSIS — I200 Unstable angina: Secondary | ICD-10-CM | POA: Diagnosis present

## 2013-12-14 DIAGNOSIS — K219 Gastro-esophageal reflux disease without esophagitis: Secondary | ICD-10-CM | POA: Diagnosis present

## 2013-12-14 DIAGNOSIS — I249 Acute ischemic heart disease, unspecified: Secondary | ICD-10-CM | POA: Diagnosis present

## 2013-12-14 DIAGNOSIS — D869 Sarcoidosis, unspecified: Secondary | ICD-10-CM | POA: Diagnosis present

## 2013-12-14 DIAGNOSIS — IMO0002 Reserved for concepts with insufficient information to code with codable children: Secondary | ICD-10-CM

## 2013-12-14 DIAGNOSIS — I2510 Atherosclerotic heart disease of native coronary artery without angina pectoris: Principal | ICD-10-CM | POA: Diagnosis present

## 2013-12-14 DIAGNOSIS — E559 Vitamin D deficiency, unspecified: Secondary | ICD-10-CM | POA: Diagnosis present

## 2013-12-14 DIAGNOSIS — M899 Disorder of bone, unspecified: Secondary | ICD-10-CM | POA: Diagnosis present

## 2013-12-14 DIAGNOSIS — I214 Non-ST elevation (NSTEMI) myocardial infarction: Secondary | ICD-10-CM

## 2013-12-14 DIAGNOSIS — Z96659 Presence of unspecified artificial knee joint: Secondary | ICD-10-CM

## 2013-12-14 DIAGNOSIS — E119 Type 2 diabetes mellitus without complications: Secondary | ICD-10-CM | POA: Diagnosis present

## 2013-12-14 DIAGNOSIS — Z87891 Personal history of nicotine dependence: Secondary | ICD-10-CM

## 2013-12-14 DIAGNOSIS — M1990 Unspecified osteoarthritis, unspecified site: Secondary | ICD-10-CM | POA: Diagnosis present

## 2013-12-14 DIAGNOSIS — I10 Essential (primary) hypertension: Secondary | ICD-10-CM | POA: Diagnosis present

## 2013-12-14 HISTORY — DX: Atherosclerotic heart disease of native coronary artery without angina pectoris: I25.10

## 2013-12-14 LAB — COMPREHENSIVE METABOLIC PANEL
AST: 59 U/L — ABNORMAL HIGH (ref 0–37)
Albumin: 3.5 g/dL (ref 3.5–5.2)
Alkaline Phosphatase: 34 U/L — ABNORMAL LOW (ref 39–117)
CO2: 24 mEq/L (ref 19–32)
Chloride: 109 mEq/L (ref 96–112)
Creatinine, Ser: 0.74 mg/dL (ref 0.50–1.10)
GFR calc non Af Amer: 81 mL/min — ABNORMAL LOW (ref >90)
Sodium: 145 mEq/L (ref 137–147)
Total Bilirubin: 1 mg/dL (ref 0.3–1.2)
Total Protein: 6.8 g/dL (ref 6.0–8.3)

## 2013-12-14 LAB — CBC
MCH: 31.6 pg (ref 26.0–34.0)
MCHC: 31.9 g/dL (ref 30.0–36.0)
Platelets: 191 10*3/uL (ref 150–400)
WBC: 6.7 10*3/uL (ref 4.0–10.5)

## 2013-12-14 LAB — URINALYSIS, ROUTINE W REFLEX MICROSCOPIC
Glucose, UA: NEGATIVE mg/dL
Ketones, ur: NEGATIVE mg/dL
Leukocytes, UA: NEGATIVE
Nitrite: NEGATIVE
Protein, ur: 30 mg/dL — AB
Urobilinogen, UA: 0.2 mg/dL (ref 0.0–1.0)

## 2013-12-14 LAB — URINE MICROSCOPIC-ADD ON

## 2013-12-14 LAB — POCT I-STAT TROPONIN I

## 2013-12-14 LAB — TROPONIN I: Troponin I: 1.54 ng/mL (ref <0.30)

## 2013-12-14 MED ORDER — ENOXAPARIN SODIUM 100 MG/ML ~~LOC~~ SOLN
90.0000 mg | Freq: Once | SUBCUTANEOUS | Status: DC
  Filled 2013-12-14: qty 1

## 2013-12-14 MED ORDER — ENOXAPARIN SODIUM 100 MG/ML ~~LOC~~ SOLN
90.0000 mg | Freq: Once | SUBCUTANEOUS | Status: AC
  Administered 2013-12-14: 90 mg via SUBCUTANEOUS

## 2013-12-14 MED ORDER — ASPIRIN 81 MG PO CHEW
324.0000 mg | CHEWABLE_TABLET | Freq: Once | ORAL | Status: AC
  Administered 2013-12-14: 324 mg via ORAL
  Filled 2013-12-14: qty 4

## 2013-12-14 NOTE — ED Notes (Signed)
Pt to department from home- pt reports that she felt like she was going to pass out and eased herself to the floor. EMS reports that they were unable to palpate a pulse or bp. Reports initial pressure was 90/70, last  Was 110/70 Hr-80 Cbg-81

## 2013-12-14 NOTE — ED Notes (Signed)
Family at bedside. 

## 2013-12-14 NOTE — ED Notes (Signed)
Patient denies pain and is resting comfortably.  

## 2013-12-14 NOTE — H&P (Signed)
Cardiology History and Physical  Rene Paci, MD  History of Present Illness (and review of medical records): [name] is a 77 y.o. female who presents for evaluation after presyncopal episode at home.  She reports no prior cardiac history of MI or known CAD.  She has HTN, DM, Sarcoidosis on methotrexate and prednisone, along with home 02 and prior hx of tobacco use.  She was sitting on stool at home when she "slipped" off.  When she tried to get up she could not due to dizziness.  She laid there for around 10-63minutes until family found her on the floor.  She had no trauma, no focal weakness or slurred speech.  She denied any chest pain or shortness of breath.  She denied any palpitations.  Of note she has had dizziness off and on for the past few weeks.  She has also had fluctuating BP with it be low at times.  She has also not being eating and drinking well while taking her BP meds which include HCTZ.   Her family called EMS and she was brought to the ED for further evaluation.  EMS reports low initial BP.  Her initial POC trop was 1.03.  She remains chest pain free.  Review of Systems She denies any fevers, chills, night sweats.  No sick contacts at home. Of note, her spouse had recent unexpected death 2013/12/03. Further review of systems was otherwise negative other than stated in HPI.  Patient Active Problem List   Diagnosis Date Noted  . ACS (acute coronary syndrome) 12/14/2013  . Osteoarthritis 08/11/2013  . Hypoxia 06/25/2013  . Bronchitis, chronic obstructive w acute bronchitis 01/26/2013  . UNSTEADY GAIT 01/04/2009  . UNSPECIFIED VITAMIN D DEFICIENCY 09/30/2008  . OSTEOPENIA 06/24/2008  . DIABETES MELLITUS, TYPE II, BORDERLINE 05/25/2008  . GERD 01/22/2008  . Sarcoidosis 10/07/2007  . HYPERTENSION 10/07/2007  . ALLERGIC RHINITIS 10/07/2007   Past Medical History  Diagnosis Date  . Sarcoidosis     hypoxic resp failure, follows with pulm for same  .  Hypertension   . Allergic rhinitis, cause unspecified   . Esophageal reflux   . Vertigo   . Unspecified vitamin D deficiency   . OSTEOPENIA   . DIABETES MELLITUS, TYPE II, BORDERLINE   . Osteoarthritis     s/p TKR x 2 right    Past Surgical History  Procedure Laterality Date  . Appendectomy    . Knee arthroscopy    . Total knee arthroplasty Right     x2    Prescriptions prior to admission  Medication Sig Dispense Refill  . Cholecalciferol (VITAMIN D) 2000 UNITS CAPS Take 1 capsule by mouth daily.        . folic acid (FOLVITE) 1 MG tablet Take 1 tablet (1 mg total) by mouth daily.  30 tablet  5  . hydrochlorothiazide (MICROZIDE) 12.5 MG capsule Take 12.5 mg by mouth daily as needed (for BP).      Marland Kitchen losartan (COZAAR) 25 MG tablet Take 1 tablet (25 mg total) by mouth daily.  30 tablet  5  . Menthol, Topical Analgesic, (BLUE-EMU MAXIMUM STRENGTH) 2.5 % LIQD Apply 1 application topically daily as needed (for k nee pain).      . methotrexate (RHEUMATREX) 2.5 MG tablet Take 10 mg by mouth once a week. On Mondays      . Multiple Vitamins-Minerals (CENTRUM SILVER PO) Take 1 tablet by mouth daily.        . predniSONE (DELTASONE) 5  MG tablet Take 5 mg by mouth every other day.      . traMADol (ULTRAM) 50 MG tablet Take 1 tablet (50 mg total) by mouth every 8 (eight) hours as needed for moderate pain or severe pain.  40 tablet  1   No Known Allergies  History  Substance Use Topics  . Smoking status: Former Smoker -- 0.80 packs/day for 20 years    Types: Cigarettes    Quit date: 12/16/1985  . Smokeless tobacco: Never Used  . Alcohol Use: No    Family History  Problem Relation Age of Onset  . Asthma Sister   . Bone cancer Mother   . Diabetes type II Sister      Objective:  Patient Vitals for the past 8 hrs:  BP Temp Temp src Pulse Resp SpO2  12/14/13 2300 119/71 mmHg - - 73 19 99 %  12/14/13 2245 129/71 mmHg - - 69 19 99 %  12/14/13 2230 123/74 mmHg - - 70 19 99 %  12/14/13 2210  135/90 mmHg - - 95 - -  12/14/13 2208 133/101 mmHg - - 80 - -  12/14/13 2205 133/73 mmHg - - 72 - -  12/14/13 2145 121/78 mmHg - - 73 21 99 %  12/14/13 2130 135/78 mmHg - - 77 18 100 %  12/14/13 2115 124/77 mmHg - - 77 22 97 %  12/14/13 2100 120/64 mmHg - - 74 22 100 %  12/14/13 2045 130/87 mmHg - - - 22 -  12/14/13 2030 129/84 mmHg - - - 20 -  12/14/13 2015 127/83 mmHg - - - 21 -  12/14/13 2000 142/81 mmHg - - - 21 -  12/14/13 1930 142/83 mmHg - - 78 - 98 %  12/14/13 1858 138/79 mmHg 98.4 F (36.9 C) Oral 85 25 93 %   General appearance: alert, cooperative, elderly appearing female in NAD Head: Normocephalic, without obvious abnormality, atraumatic Eyes: conjunctivae/corneas clear. PERRL, EOM's intact. Fundi benign. Neck: no carotid bruit, no JVD and supple, symmetrical, trachea midline Lungs: clear to auscultation bilaterally Chest wall: no tenderness Heart: regular rate and rhythm, S1, S2 normal Abdomen: soft, non-tender; bowel sounds normal; no masses,  no organomegaly Extremities: extremities normal, atraumatic, no cyanosis or edema Pulses: 2+ and symmetric Neurologic: Grossly normal  Results for orders placed during the hospital encounter of 12/14/13 (from the past 48 hour(s))  GLUCOSE, CAPILLARY     Status: Abnormal   Collection Time    12/14/13  7:00 PM      Result Value Range   Glucose-Capillary 125 (*) 70 - 99 mg/dL  URINALYSIS, ROUTINE W REFLEX MICROSCOPIC     Status: Abnormal   Collection Time    12/14/13  8:00 PM      Result Value Range   Color, Urine YELLOW  YELLOW   APPearance CLOUDY (*) CLEAR   Specific Gravity, Urine 1.008  1.005 - 1.030   pH 6.0  5.0 - 8.0   Glucose, UA NEGATIVE  NEGATIVE mg/dL   Hgb urine dipstick TRACE (*) NEGATIVE   Bilirubin Urine NEGATIVE  NEGATIVE   Ketones, ur NEGATIVE  NEGATIVE mg/dL   Protein, ur 30 (*) NEGATIVE mg/dL   Urobilinogen, UA 0.2  0.0 - 1.0 mg/dL   Nitrite NEGATIVE  NEGATIVE   Leukocytes, UA NEGATIVE  NEGATIVE   URINE MICROSCOPIC-ADD ON     Status: Abnormal   Collection Time    12/14/13  8:00 PM      Result Value Range  Squamous Epithelial / LPF RARE  RARE   WBC, UA 0-2  <3 WBC/hpf   RBC / HPF 3-6  <3 RBC/hpf   Bacteria, UA FEW (*) RARE  CBC     Status: Abnormal   Collection Time    12/14/13  8:25 PM      Result Value Range   WBC 6.7  4.0 - 10.5 K/uL   RBC 4.08  3.87 - 5.11 MIL/uL   Hemoglobin 12.9  12.0 - 15.0 g/dL   HCT 16.1  09.6 - 04.5 %   MCV 99.0  78.0 - 100.0 fL   MCH 31.6  26.0 - 34.0 pg   MCHC 31.9  30.0 - 36.0 g/dL   RDW 40.9 (*) 81.1 - 91.4 %   Platelets 191  150 - 400 K/uL  POCT I-STAT TROPONIN I     Status: Abnormal   Collection Time    12/14/13  8:58 PM      Result Value Range   Troponin i, poc 1.03 (*) 0.00 - 0.08 ng/mL   Comment NOTIFIED PHYSICIAN     Comment 3            Comment: Due to the release kinetics of cTnI,     a negative result within the first hours     of the onset of symptoms does not rule out     myocardial infarction with certainty.     If myocardial infarction is still suspected,     repeat the test at appropriate intervals.  TROPONIN I     Status: Abnormal   Collection Time    12/14/13 10:00 PM      Result Value Range   Troponin I 1.54 (*) <0.30 ng/mL   Comment:            Due to the release kinetics of cTnI,     a negative result within the first hours     of the onset of symptoms does not rule out     myocardial infarction with certainty.     If myocardial infarction is still suspected,     repeat the test at appropriate intervals.     CRITICAL RESULT CALLED TO, READ BACK BY AND VERIFIED WITH:     BROWNING M,RN 12/14/13 2243 WAYK  COMPREHENSIVE METABOLIC PANEL     Status: Abnormal   Collection Time    12/14/13 10:00 PM      Result Value Range   Sodium 145  137 - 147 mEq/L   Comment: Please note change in reference range.   Potassium 5.8 (*) 3.7 - 5.3 mEq/L   Comment: HEMOLYSIS AT THIS LEVEL MAY AFFECT RESULT     Please note change  in reference range.   Chloride 109  96 - 112 mEq/L   CO2 24  19 - 32 mEq/L   Glucose, Bld 100 (*) 70 - 99 mg/dL   BUN 14  6 - 23 mg/dL   Creatinine, Ser 7.82  0.50 - 1.10 mg/dL   Calcium 8.6  8.4 - 95.6 mg/dL   Total Protein 6.8  6.0 - 8.3 g/dL   Albumin 3.5  3.5 - 5.2 g/dL   AST 59 (*) 0 - 37 U/L   Comment: HEMOLYSIS AT THIS LEVEL MAY AFFECT RESULT   ALT 24  0 - 35 U/L   Alkaline Phosphatase 34 (*) 39 - 117 U/L   Total Bilirubin 1.0  0.3 - 1.2 mg/dL   GFR calc non Af Amer 81 (*) >  90 mL/min   GFR calc Af Amer >90  >90 mL/min   Comment: (NOTE)     The eGFR has been calculated using the CKD EPI equation.     This calculation has not been validated in all clinical situations.     eGFR's persistently <90 mL/min signify possible Chronic Kidney     Disease.   No results found.  ECG:  Sinus rhythm HR 84, frequent PVCs, inferior TWI, anterolateral ST and T wave abnormality, consider ischemia.  Assessment: 49F hx of sarcoid presents with presyncopal episode found to have positive troponin and abnormal ecg. Atypical presentation for ACS. Given recent unexpected death of spouse, stress CMP is possible.  Plan:  1. Cardiology Admission  2. Continuous monitoring on Telemetry. 3. Repeat ekg on admit, prn chest pain or arrythmia 4. Trend cardiac biomarkers 5. Medical management to include ASA, Lovenox given in ED, BB, Statin. 6. TTE in am assess LV function, wall motion and for valvular disorders. 7. Further ischemic evaluation pending initial studies. NPO @ MN. 8. Gentle hydration.

## 2013-12-14 NOTE — ED Provider Notes (Signed)
CSN: [id number]     Arrival date & time 12/14/13  1849 History   First MD Initiated Contact with Patient 12/14/13 1901     Chief Complaint  Patient presents with  . Near Syncope    HPI  77 y/o female with history as noted below who presents after a pre-syncopal episode. The patient states she was in her usual state of health all day. This evening the patient attempted to walk to the kitchen. She went to sit down on a stool and began to feel lightheaded and like she was going to pass out. She eased herself to the ground without injuring herself. She states she felt too weak to get back up. Her grandson found her laying on the floor conscious after approximately 15 minutes of being on the ground. The patient denies any additional symptoms other than feeling weak at the time. She now denies any symptoms. She denies any unilateral weakness or numbness.   Past Medical History  Diagnosis Date  . Sarcoidosis     hypoxic resp failure, follows with pulm for same  . Hypertension   . Allergic rhinitis, cause unspecified   . Esophageal reflux   . Vertigo   . Unspecified vitamin D deficiency   . OSTEOPENIA   . DIABETES MELLITUS, TYPE II, BORDERLINE   . Osteoarthritis     s/p TKR x 2 right   Past Surgical History  Procedure Laterality Date  . Appendectomy    . Knee arthroscopy    . Total knee arthroplasty Right     x2   Family History  Problem Relation Age of Onset  . Asthma Sister   . Bone cancer Mother   . Diabetes type II Sister    History  Substance Use Topics  . Smoking status: Former Smoker -- 0.80 packs/day for 20 years    Types: Cigarettes    Quit date: 12/16/1985  . Smokeless tobacco: Never Used  . Alcohol Use: No   OB History   Grav Para Term Preterm Abortions TAB SAB Ect Mult Living                 Review of Systems  Constitutional: Negative for fever.  Respiratory: Negative for cough and shortness of breath.   Cardiovascular: Negative for chest pain.   Gastrointestinal: Negative for nausea, vomiting and abdominal pain.  Neurological: Negative for weakness, numbness and headaches.  All other systems reviewed and are negative.   Allergies  Review of patient's allergies indicates no known allergies.  Home Medications   Current Outpatient Rx  Name  Route  Sig  Dispense  Refill  . Cholecalciferol (VITAMIN D) 2000 UNITS CAPS   Oral   Take 1 capsule by mouth daily.           . folic acid (FOLVITE) 1 MG tablet   Oral   Take 1 tablet (1 mg total) by mouth daily.   30 tablet   5   . hydrochlorothiazide (MICROZIDE) 12.5 MG capsule   Oral   Take 12.5 mg by mouth daily as needed (for BP).         Marland Kitchen losartan (COZAAR) 25 MG tablet   Oral   Take 1 tablet (25 mg total) by mouth daily.   30 tablet   5   . Menthol, Topical Analgesic, (BLUE-EMU MAXIMUM STRENGTH) 2.5 % LIQD   Apply externally   Apply 1 application topically daily as needed (for k nee pain).         Marland Kitchen  methotrexate (RHEUMATREX) 2.5 MG tablet   Oral   Take 10 mg by mouth once a week. On Mondays         . Multiple Vitamins-Minerals (CENTRUM SILVER PO)   Oral   Take 1 tablet by mouth daily.           . predniSONE (DELTASONE) 5 MG tablet   Oral   Take 5 mg by mouth every other day.         . traMADol (ULTRAM) 50 MG tablet   Oral   Take 1 tablet (50 mg total) by mouth every 8 (eight) hours as needed for moderate pain or severe pain.   40 tablet   1    BP 129/71  Pulse 69  Temp(Src) 98.4 F (36.9 C) (Oral)  Resp 19  SpO2 99% Physical Exam  Nursing note and vitals reviewed. Constitutional: She is oriented to person, place, and time. She appears well-developed and well-nourished. No distress.  HENT:  Head: Normocephalic and atraumatic.  Eyes: Conjunctivae are normal. Pupils are equal, round, and reactive to light.  Neck: Normal range of motion. Neck supple.  Cardiovascular: Normal rate and regular rhythm.  Exam reveals no gallop and no friction  rub.   No murmur heard. Pulmonary/Chest: Effort normal and breath sounds normal.  Abdominal: Soft. She exhibits no distension. There is no tenderness.  Musculoskeletal: Normal range of motion. She exhibits no edema and no tenderness.  Neurological: She is alert and oriented to person, place, and time. She has normal strength and normal reflexes. No cranial nerve deficit or sensory deficit.  Skin: Skin is warm and dry.  Psychiatric: She has a normal mood and affect.   ED Course  Procedures (including critical care time) Labs Review Labs Reviewed  GLUCOSE, CAPILLARY - Abnormal; Notable for the following:    Glucose-Capillary 125 (*)    All other components within normal limits  CBC - Abnormal; Notable for the following:    RDW 15.8 (*)    All other components within normal limits  URINALYSIS, ROUTINE W REFLEX MICROSCOPIC - Abnormal; Notable for the following:    APPearance CLOUDY (*)    Hgb urine dipstick TRACE (*)    Protein, ur 30 (*)    All other components within normal limits  URINE MICROSCOPIC-ADD ON - Abnormal; Notable for the following:    Bacteria, UA FEW (*)    All other components within normal limits  TROPONIN I - Abnormal; Notable for the following:    Troponin I 1.54 (*)    All other components within normal limits  COMPREHENSIVE METABOLIC PANEL - Abnormal; Notable for the following:    Potassium 5.8 (*)    Glucose, Bld 100 (*)    AST 59 (*)    Alkaline Phosphatase 34 (*)    GFR calc non Af Amer 81 (*)    All other components within normal limits  POCT I-STAT TROPONIN I - Abnormal; Notable for the following:    Troponin i, poc 1.03 (*)    All other components within normal limits   Imaging Review No results found.  EKG Interpretation    Date/Time:  Tuesday December 14 2013 18:57:51 EST Ventricular Rate:  84 PR Interval:  187 QRS Duration: 100 QT Interval:  416 QTC Calculation: 492 R Axis:   147 Text Interpretation:  Sinus rhythm Ventricular bigeminy  Right axis deviation Repol abnrm, severe global ischemia (LM/MVD) Baseline wander in lead(s) I II aVR aVL aVF repolarization abnormality (inferior-lateral T wave  inversion with ST depression is new compared to previous ECG) Confirmed by POLLINA  MD, CHRISTOPHER ([id number]) on 12/14/2013 7:03:28 PM           MDM   1. ACS (acute coronary syndrome)   2. NSTEMI (non-ST elevated myocardial infarction)     Presents after sycnopal episode. Per EMS the patient was hypotensive on their arrival. HDS here. She currently denies any symptoms. EKG with TWI in inferior leads. Non focal neurological exam. CBC unremarkable. UA not c/w UTI. Troponin elevated to 1.54. DDx: elevated troponin secondary to transient hypotension vs. Silent infarct. Cardiology was consulted. ASA and lovenox given. Cardiology to admit.   Shanon Ace, MD 12/14/13 [phone]

## 2013-12-14 NOTE — ED Notes (Signed)
Vital signs stable. 

## 2013-12-15 ENCOUNTER — Encounter (HOSPITAL_COMMUNITY): Payer: Self-pay | Admitting: *Deleted

## 2013-12-15 ENCOUNTER — Encounter (HOSPITAL_COMMUNITY)
Admission: EM | Disposition: A | Discharge status: Home / Self Care | Payer: Self-pay | Source: Home / Self Care | Attending: Cardiology

## 2013-12-15 DIAGNOSIS — R55 Syncope and collapse: Secondary | ICD-10-CM

## 2013-12-15 DIAGNOSIS — E119 Type 2 diabetes mellitus without complications: Secondary | ICD-10-CM

## 2013-12-15 HISTORY — PX: LEFT HEART CATHETERIZATION WITH CORONARY ANGIOGRAM: SHX5451

## 2013-12-15 LAB — CBC
HCT: 41.6 % (ref 36.0–46.0)
Hemoglobin: 11.3 g/dL — ABNORMAL LOW (ref 12.0–15.0)
Hemoglobin: 12.9 g/dL (ref 12.0–15.0)
MCHC: 31.3 g/dL (ref 30.0–36.0)
MCV: 101.2 fL — ABNORMAL HIGH (ref 78.0–100.0)
Platelets: 179 10*3/uL (ref 150–400)
RBC: 3.64 MIL/uL — ABNORMAL LOW (ref 3.87–5.11)
RBC: 4.11 MIL/uL (ref 3.87–5.11)
RDW: 16.2 % — ABNORMAL HIGH (ref 11.5–15.5)
WBC: 6 10*3/uL (ref 4.0–10.5)
WBC: 3.4 10*3/uL — ABNORMAL LOW (ref 4.0–10.5)

## 2013-12-15 LAB — GLUCOSE, CAPILLARY
Glucose-Capillary: 61 mg/dL — ABNORMAL LOW (ref 70–99)
Glucose-Capillary: 63 mg/dL — ABNORMAL LOW (ref 70–99)
Glucose-Capillary: 109 mg/dL — ABNORMAL HIGH (ref 70–99)

## 2013-12-15 LAB — BASIC METABOLIC PANEL
CO2: 30 mEq/L (ref 19–32)
Calcium: 8.2 mg/dL — ABNORMAL LOW (ref 8.4–10.5)
GFR calc non Af Amer: 59 mL/min — ABNORMAL LOW (ref >90)
Glucose, Bld: 162 mg/dL — ABNORMAL HIGH (ref 70–99)
Potassium: 4.3 mEq/L (ref 3.7–5.3)
Sodium: 145 mEq/L (ref 137–147)

## 2013-12-15 LAB — TROPONIN I
Troponin I: 4.51 ng/mL (ref <0.30)
Troponin I: 3.99 ng/mL (ref <0.30)

## 2013-12-15 LAB — PROTIME-INR
INR: 1.16 (ref 0.00–1.49)
Prothrombin Time: 14.6 seconds (ref 11.6–15.2)

## 2013-12-15 LAB — MRSA PCR SCREENING: MRSA by PCR: NEGATIVE

## 2013-12-15 LAB — MAGNESIUM: Magnesium: 2.1 mg/dL (ref 1.5–2.5)

## 2013-12-15 LAB — CREATININE, SERUM
Creatinine, Ser: 0.78 mg/dL (ref 0.50–1.10)
GFR calc Af Amer: >90 mL/min (ref >90)
GFR calc non Af Amer: 80 mL/min — ABNORMAL LOW (ref >90)

## 2013-12-15 SURGERY — LEFT HEART CATHETERIZATION WITH CORONARY ANGIOGRAM
Anesthesia: LOCAL

## 2013-12-15 MED ORDER — FOLIC ACID 1 MG PO TABS
1.0000 mg | ORAL_TABLET | Freq: Every day | ORAL | Status: DC
  Administered 2013-12-15: 1 mg via ORAL
  Filled 2013-12-15 (×2): qty 1

## 2013-12-15 MED ORDER — HEPARIN (PORCINE) IN NACL 2-0.9 UNIT/ML-% IJ SOLN
INTRAMUSCULAR | Status: AC
  Filled 2013-12-15: qty 1000

## 2013-12-15 MED ORDER — ASPIRIN 81 MG PO CHEW: 81.0000 mg | CHEWABLE_TABLET | ORAL | Status: DC

## 2013-12-15 MED ORDER — SODIUM CHLORIDE 0.9 % IJ SOLN: 3.0000 mL | INTRAMUSCULAR | Status: DC | PRN

## 2013-12-15 MED ORDER — MIDAZOLAM HCL 2 MG/2ML IJ SOLN
INTRAMUSCULAR | Status: AC
  Filled 2013-12-15: qty 2

## 2013-12-15 MED ORDER — VITAMIN D3 25 MCG (1000 UNIT) PO TABS
1000.0000 [IU] | ORAL_TABLET | Freq: Every day | ORAL | Status: DC
  Administered 2013-12-15: 1000 [IU] via ORAL
  Filled 2013-12-15 (×2): qty 1

## 2013-12-15 MED ORDER — ACETAMINOPHEN 325 MG PO TABS: 650.0000 mg | ORAL_TABLET | ORAL | Status: DC | PRN

## 2013-12-15 MED ORDER — SODIUM CHLORIDE 0.9 % IJ SOLN
3.0000 mL | Freq: Two times a day (BID) | INTRAMUSCULAR | Status: DC
  Administered 2013-12-15: 3 mL via INTRAVENOUS

## 2013-12-15 MED ORDER — LOSARTAN POTASSIUM 25 MG PO TABS
25.0000 mg | ORAL_TABLET | Freq: Every day | ORAL | Status: DC
  Administered 2013-12-15: 25 mg via ORAL
  Filled 2013-12-15 (×2): qty 1

## 2013-12-15 MED ORDER — ASPIRIN EC 81 MG PO TBEC
81.0000 mg | DELAYED_RELEASE_TABLET | Freq: Every day | ORAL | Status: DC
  Administered 2013-12-15: 81 mg via ORAL
  Filled 2013-12-15: qty 1

## 2013-12-15 MED ORDER — SODIUM CHLORIDE 0.9 % IV SOLN
1.0000 mL/kg/h | INTRAVENOUS | Status: AC
  Administered 2013-12-15: 1 mL/kg/h via INTRAVENOUS

## 2013-12-15 MED ORDER — NITROGLYCERIN 0.4 MG SL SUBL: 0.4000 mg | SUBLINGUAL_TABLET | SUBLINGUAL | Status: DC | PRN

## 2013-12-15 MED ORDER — ATORVASTATIN CALCIUM 80 MG PO TABS
80.0000 mg | ORAL_TABLET | Freq: Every day | ORAL | Status: DC
  Administered 2013-12-15: 80 mg via ORAL
  Filled 2013-12-15 (×3): qty 1

## 2013-12-15 MED ORDER — HEPARIN SODIUM (PORCINE) 1000 UNIT/ML IJ SOLN
INTRAMUSCULAR | Status: AC
  Filled 2013-12-15: qty 1

## 2013-12-15 MED ORDER — SODIUM CHLORIDE 0.9 % IV SOLN
1.0000 mL/kg/h | INTRAVENOUS | Status: DC
  Administered 2013-12-15: 1 mL/kg/h via INTRAVENOUS

## 2013-12-15 MED ORDER — SODIUM CHLORIDE 0.9 % IV SOLN: 250.0000 mL | INTRAVENOUS | Status: DC | PRN

## 2013-12-15 MED ORDER — ENOXAPARIN SODIUM 40 MG/0.4ML ~~LOC~~ SOLN
40.0000 mg | SUBCUTANEOUS | Status: DC
  Filled 2013-12-15 (×2): qty 0.4

## 2013-12-15 MED ORDER — ONDANSETRON HCL 4 MG/2ML IJ SOLN: 4.0000 mg | Freq: Four times a day (QID) | INTRAMUSCULAR | Status: DC | PRN

## 2013-12-15 MED ORDER — VERAPAMIL HCL 2.5 MG/ML IV SOLN
INTRAVENOUS | Status: AC
  Filled 2013-12-15: qty 2

## 2013-12-15 MED ORDER — SODIUM CHLORIDE 0.9 % IV SOLN
INTRAVENOUS | Status: AC
  Administered 2013-12-15: via INTRAVENOUS

## 2013-12-15 MED ORDER — VITAMIN D 50 MCG (2000 UT) PO CAPS: 1.0000 | ORAL_CAPSULE | Freq: Every day | ORAL | Status: DC

## 2013-12-15 MED ORDER — METOPROLOL TARTRATE 12.5 MG HALF TABLET
12.5000 mg | ORAL_TABLET | Freq: Two times a day (BID) | ORAL | Status: DC
  Administered 2013-12-15 (×3): 12.5 mg via ORAL
  Filled 2013-12-15 (×6): qty 1

## 2013-12-15 MED ORDER — LIDOCAINE HCL (PF) 1 % IJ SOLN
INTRAMUSCULAR | Status: AC
  Filled 2013-12-15: qty 30

## 2013-12-15 MED ORDER — ENOXAPARIN SODIUM 100 MG/ML ~~LOC~~ SOLN
90.0000 mg | Freq: Two times a day (BID) | SUBCUTANEOUS | Status: DC
  Administered 2013-12-15: 90 mg via SUBCUTANEOUS
  Filled 2013-12-15 (×2): qty 1

## 2013-12-15 MED ORDER — PREDNISONE 5 MG PO TABS
5.0000 mg | ORAL_TABLET | ORAL | Status: DC
  Administered 2013-12-15: 5 mg via ORAL
  Filled 2013-12-15: qty 1

## 2013-12-15 NOTE — Care Management Note (Signed)
    Page 1 of 1   12/15/2013     8:22:47 AM   CARE MANAGEMENT NOTE 12/15/2013  Patient:  [name], [name]   Account Number:  [id number]  Date Initiated:  12/15/2013  Documentation initiated by:  Junius Creamer  Subjective/Objective Assessment:   adm w mi     Action/Plan:   lives w husband, pcp dr Vikki Ports lescher   Anticipated DC Date:     Anticipated DC Plan:           Choice offered to / List presented to:             Status of service:   Medicare Important Message given?   (If response is "NO", the following Medicare IM given date fields will be blank) Date Medicare IM given:   Date Additional Medicare IM given:    Discharge Disposition:    Per UR Regulation:  Reviewed for med. necessity/level of care/duration of stay  If discussed at Long Length of Stay Meetings, dates discussed:    Comments:

## 2013-12-15 NOTE — Progress Notes (Signed)
ANTICOAGULATION CONSULT NOTE - Initial Consult  Pharmacy Consult for Lovenox Indication: chest pain/ACS  No Known Allergies  Patient Measurements: Height: 5\' 7"  (170.2 cm) Weight: 194 lb 0.1 oz (88 kg) IBW/kg (Calculated) : 61.6   Vital Signs: Temp: 97.4 F (36.3 C) (12/31 0026) Temp src: Oral (12/31 0026) BP: 128/85 mmHg (12/31 0026) Pulse Rate: 77 (12/31 0026)  Labs:  Recent Labs  12/14/13 2025 12/14/13 2200  HGB 12.9  --   HCT 40.4  --   PLT 191  --   CREATININE  --  0.74  TROPONINI  --  1.54*    Estimated Creatinine Clearance: 69.3 ml/min (by C-G formula based on Cr of 0.74).   Medical History: Past Medical History  Diagnosis Date  . Sarcoidosis     hypoxic resp failure, follows with pulm for same  . Hypertension   . Allergic rhinitis, cause unspecified   . Esophageal reflux   . Vertigo   . Unspecified vitamin D deficiency   . OSTEOPENIA   . DIABETES MELLITUS, TYPE II, BORDERLINE   . Osteoarthritis     s/p TKR x 2 right    Medications:  Prescriptions prior to admission  Medication Sig Dispense Refill  . Cholecalciferol (VITAMIN D) 2000 UNITS CAPS Take 1 capsule by mouth daily.        . folic acid (FOLVITE) 1 MG tablet Take 1 tablet (1 mg total) by mouth daily.  30 tablet  5  . hydrochlorothiazide (MICROZIDE) 12.5 MG capsule Take 12.5 mg by mouth daily as needed (for BP).      Marland Kitchen losartan (COZAAR) 25 MG tablet Take 1 tablet (25 mg total) by mouth daily.  30 tablet  5  . Menthol, Topical Analgesic, (BLUE-EMU MAXIMUM STRENGTH) 2.5 % LIQD Apply 1 application topically daily as needed (for k nee pain).      . methotrexate (RHEUMATREX) 2.5 MG tablet Take 10 mg by mouth once a week. On Mondays      . Multiple Vitamins-Minerals (CENTRUM SILVER PO) Take 1 tablet by mouth daily.        . predniSONE (DELTASONE) 5 MG tablet Take 5 mg by mouth every other day.      . traMADol (ULTRAM) 50 MG tablet Take 1 tablet (50 mg total) by mouth every 8 (eight) hours as  needed for moderate pain or severe pain.  40 tablet  1    Assessment: 77 yo female with syncope, elevated cardiac markers, for Lovenox  Goal of Therapy:  Full anticoagulation with Lovenox Monitor platelets by anticoagulation protocol: Yes   Plan:  Lovenox 90 mg SQ q12h  Eddie Candle 12/15/2013,12:54 AM

## 2013-12-15 NOTE — Progress Notes (Signed)
Not coming back from the cath lab. Belongings endorsed by the nurse tech. to the staff in the cath lab.

## 2013-12-15 NOTE — Interval H&P Note (Signed)
History and Physical Interval Note:  12/15/2013 1:23 PM  [name]  has presented today for surgery, with the diagnosis of cp  The various methods of treatment have been discussed with the patient and family. After consideration of risks, benefits and other options for treatment, the patient has consented to  Procedure(s): LEFT HEART CATHETERIZATION WITH CORONARY ANGIOGRAM (N/A) as a surgical intervention .  The patient's history has been reviewed, patient examined, no change in status, stable for surgery.  I have reviewed the patient's chart and labs.  Questions were answered to the patient's satisfaction.    Cath Lab Visit (complete for each Cath Lab visit)  Clinical Evaluation Leading to the Procedure:   ACS: yes  Non-ACS:    Anginal Classification: No Symptoms  Anti-ischemic medical therapy: Minimal Therapy (1 class of medications)  Non-Invasive Test Results: No non-invasive testing performed  Prior CABG: No previous CABG       Theron Arista [facility] 12/15/2013 1:23 PM

## 2013-12-15 NOTE — Progress Notes (Signed)
Nutrition Brief Note  Patient identified on the Malnutrition Screening Tool (MST) Report  Wt Readings from Last 15 Encounters:  12/15/13 194 lb 0.1 oz (88 kg)  12/07/13 192 lb 6.4 oz (87.272 kg)  09/17/13 190 lb 12.8 oz (86.546 kg)  08/11/13 194 lb 1.9 oz (88.052 kg)  07/28/13 194 lb 6.4 oz (88.179 kg)  06/25/13 204 lb 12.8 oz (92.897 kg)  05/03/13 209 lb 12.8 oz (95.165 kg)  08/20/12 211 lb 12.8 oz (96.072 kg)  03/27/12 219 lb (99.338 kg)  03/12/12 221 lb 12.8 oz (100.608 kg)  10/29/11 220 lb (99.791 kg)  07/23/11 226 lb 6.4 oz (102.694 kg)  04/12/11 226 lb 6.4 oz (102.694 kg)  02/13/11 219 lb 8 oz (99.565 kg)  03/23/10 221 lb 5 oz (100.387 kg)    Body mass index is 30.38 kg/(m^2). Patient meets criteria for obesity, class 1 based on current BMI.   Current diet order is NPO Labs and medications reviewed.   RD met with pt who states her appetite and intake have been consistent with her usual.  She states her husband passed away this month which has been hard, but she feels she has continued to eat well.  States she was planning to fix lunch when she slid off her chair at home and couldn't get up so she just went to sleep.  Reports she is currently hungry and if diet ordered, could eat. Pt has lost 7.6% of her usual wt in the past 1 year which is not significant for time frame. No nutrition interventions warranted at this time. If nutrition issues arise, please consult RD. Will monitor for diet advancement.   Loyce Dys, MS RD LDN Clinical Inpatient Dietitian Pager: [phone] Weekend/After hours pager: [phone]

## 2013-12-15 NOTE — Progress Notes (Signed)
To the cath lab  By bed, stable. 

## 2013-12-15 NOTE — H&P (View-Only) (Signed)
SUBJECTIVE:  No chest pain.  No SOB   PHYSICAL EXAM Filed Vitals:   12/15/13 0755 12/15/13 0800 12/15/13 0900 12/15/13 1000  BP:  117/73 111/70 131/77  Pulse:  64 82 62  Temp: 97.8 F (36.6 C)     TempSrc: Oral     Resp:  21 12 19   Height:      Weight:      SpO2:  99% 98% 100%   General:  No distress HEENT:  PERRL Lungs:  Clear Heart:  RRR Abdomen:  Positive bowel sounds, no rebound no guarding Extremities:  No edema Neuro:  Nonfocal  LABS: Lab Results  Component Value Date   TROPONINI 4.51* 12/15/2013   Results for orders placed during the hospital encounter of 12/14/13 (from the past 24 hour(s))  GLUCOSE, CAPILLARY     Status: Abnormal   Collection Time    12/14/13  7:00 PM      Result Value Range   Glucose-Capillary 125 (*) 70 - 99 mg/dL  URINALYSIS, ROUTINE W REFLEX MICROSCOPIC     Status: Abnormal   Collection Time    12/14/13  8:00 PM      Result Value Range   Color, Urine YELLOW  YELLOW   APPearance CLOUDY (*) CLEAR   Specific Gravity, Urine 1.008  1.005 - 1.030   pH 6.0  5.0 - 8.0   Glucose, UA NEGATIVE  NEGATIVE mg/dL   Hgb urine dipstick TRACE (*) NEGATIVE   Bilirubin Urine NEGATIVE  NEGATIVE   Ketones, ur NEGATIVE  NEGATIVE mg/dL   Protein, ur 30 (*) NEGATIVE mg/dL   Urobilinogen, UA 0.2  0.0 - 1.0 mg/dL   Nitrite NEGATIVE  NEGATIVE   Leukocytes, UA NEGATIVE  NEGATIVE  URINE MICROSCOPIC-ADD ON     Status: Abnormal   Collection Time    12/14/13  8:00 PM      Result Value Range   Squamous Epithelial / LPF RARE  RARE   WBC, UA 0-2  <3 WBC/hpf   RBC / HPF 3-6  <3 RBC/hpf   Bacteria, UA FEW (*) RARE  CBC     Status: Abnormal   Collection Time    12/14/13  8:25 PM      Result Value Range   WBC 6.7  4.0 - 10.5 K/uL   RBC 4.08  3.87 - 5.11 MIL/uL   Hemoglobin 12.9  12.0 - 15.0 g/dL   HCT 16.1  09.6 - 04.5 %   MCV 99.0  78.0 - 100.0 fL   MCH 31.6  26.0 - 34.0 pg   MCHC 31.9  30.0 - 36.0 g/dL   RDW 40.9 (*) 81.1 - 91.4 %   Platelets 191  150  - 400 K/uL  POCT I-STAT TROPONIN I     Status: Abnormal   Collection Time    12/14/13  8:58 PM      Result Value Range   Troponin i, poc 1.03 (*) 0.00 - 0.08 ng/mL   Comment NOTIFIED PHYSICIAN     Comment 3           TROPONIN I     Status: Abnormal   Collection Time    12/14/13 10:00 PM      Result Value Range   Troponin I 1.54 (*) <0.30 ng/mL  COMPREHENSIVE METABOLIC PANEL     Status: Abnormal   Collection Time    12/14/13 10:00 PM      Result Value Range   Sodium 145  137 -  147 mEq/L   Potassium 5.8 (*) 3.7 - 5.3 mEq/L   Chloride 109  96 - 112 mEq/L   CO2 24  19 - 32 mEq/L   Glucose, Bld 100 (*) 70 - 99 mg/dL   BUN 14  6 - 23 mg/dL   Creatinine, Ser 1.61  0.50 - 1.10 mg/dL   Calcium 8.6  8.4 - 09.6 mg/dL   Total Protein 6.8  6.0 - 8.3 g/dL   Albumin 3.5  3.5 - 5.2 g/dL   AST 59 (*) 0 - 37 U/L   ALT 24  0 - 35 U/L   Alkaline Phosphatase 34 (*) 39 - 117 U/L   Total Bilirubin 1.0  0.3 - 1.2 mg/dL   GFR calc non Af Amer 81 (*) >90 mL/min   GFR calc Af Amer >90  >90 mL/min  MRSA PCR SCREENING     Status: None   Collection Time    12/15/13 12:25 AM      Result Value Range   MRSA by PCR NEGATIVE  NEGATIVE  TROPONIN I     Status: Abnormal   Collection Time    12/15/13  1:32 AM      Result Value Range   Troponin I 5.68 (*) <0.30 ng/mL  MAGNESIUM     Status: None   Collection Time    12/15/13  1:32 AM      Result Value Range   Magnesium 2.1  1.5 - 2.5 mg/dL  CBC     Status: Abnormal   Collection Time    12/15/13  1:32 AM      Result Value Range   WBC 6.0  4.0 - 10.5 K/uL   RBC 3.64 (*) 3.87 - 5.11 MIL/uL   Hemoglobin 11.3 (*) 12.0 - 15.0 g/dL   HCT 04.5  40.9 - 81.1 %   MCV 99.2  78.0 - 100.0 fL   MCH 31.0  26.0 - 34.0 pg   MCHC 31.3  30.0 - 36.0 g/dL   RDW 91.4 (*) 78.2 - 95.6 %   Platelets 179  150 - 400 K/uL  BASIC METABOLIC PANEL     Status: Abnormal   Collection Time    12/15/13  1:32 AM      Result Value Range   Sodium 145  137 - 147 mEq/L   Potassium  4.3  3.7 - 5.3 mEq/L   Chloride 108  96 - 112 mEq/L   CO2 30  19 - 32 mEq/L   Glucose, Bld 162 (*) 70 - 99 mg/dL   BUN 14  6 - 23 mg/dL   Creatinine, Ser 2.13  0.50 - 1.10 mg/dL   Calcium 8.2 (*) 8.4 - 10.5 mg/dL   GFR calc non Af Amer 59 (*) >90 mL/min   GFR calc Af Amer 68 (*) >90 mL/min  PROTIME-INR     Status: None   Collection Time    12/15/13  1:32 AM      Result Value Range   Prothrombin Time 14.6  11.6 - 15.2 seconds   INR 1.16  0.00 - 1.49  TROPONIN I     Status: Abnormal   Collection Time    12/15/13  4:40 AM      Result Value Range   Troponin I 4.51 (*) <0.30 ng/mL    Intake/Output Summary (Last 24 hours) at 12/15/13 1110 Last data filed at 12/15/13 1100  Gross per 24 hour  Intake    500 ml  Output  650 ml  Net   -150 ml    EKG:   NSR, rate 62, axis WNL, QTc prolonged.  Twave inversion in the inferior and later leads.  Increased from previous.    ASSESSMENT AND PLAN:  ELEVATED TROPONIN:  She will need a cadiac cath.  I discussed this with her and I called her daughter.  The patient understands that risks included but are not limited to stroke (1 in 1000), death (1 in 1000), kidney failure [usually temporary] (1 in 500), bleeding (1 in 200), allergic reaction [possibly serious] (1 in 200).  The patient understands and agrees to proceed.   DM:  This has been borderline.  A1C only 6.1.  Diet control  HTN:    BP OK.    SYNCOPE:  The etiology of this was not clear.  Continue with workup above.  Echo pending.   [name] 12/15/2013 11:10 AM

## 2013-12-15 NOTE — Progress Notes (Signed)
 SUBJECTIVE:  No chest pain.  No SOB   PHYSICAL EXAM Filed Vitals:   12/15/13 0755 12/15/13 0800 12/15/13 0900 12/15/13 1000  BP:  117/73 111/70 131/77  Pulse:  64 82 62  Temp: 97.8 F (36.6 C)     TempSrc: Oral     Resp:  21 12 19  Height:      Weight:      SpO2:  99% 98% 100%   General:  No distress HEENT:  PERRL Lungs:  Clear Heart:  RRR Abdomen:  Positive bowel sounds, no rebound no guarding Extremities:  No edema Neuro:  Nonfocal  LABS: Lab Results  Component Value Date   TROPONINI 4.51* 12/15/2013   Results for orders placed during the hospital encounter of 12/14/13 (from the past 24 hour(s))  GLUCOSE, CAPILLARY     Status: Abnormal   Collection Time    12/14/13  7:00 PM      Result Value Range   Glucose-Capillary 125 (*) 70 - 99 mg/dL  URINALYSIS, ROUTINE W REFLEX MICROSCOPIC     Status: Abnormal   Collection Time    12/14/13  8:00 PM      Result Value Range   Color, Urine YELLOW  YELLOW   APPearance CLOUDY (*) CLEAR   Specific Gravity, Urine 1.008  1.005 - 1.030   pH 6.0  5.0 - 8.0   Glucose, UA NEGATIVE  NEGATIVE mg/dL   Hgb urine dipstick TRACE (*) NEGATIVE   Bilirubin Urine NEGATIVE  NEGATIVE   Ketones, ur NEGATIVE  NEGATIVE mg/dL   Protein, ur 30 (*) NEGATIVE mg/dL   Urobilinogen, UA 0.2  0.0 - 1.0 mg/dL   Nitrite NEGATIVE  NEGATIVE   Leukocytes, UA NEGATIVE  NEGATIVE  URINE MICROSCOPIC-ADD ON     Status: Abnormal   Collection Time    12/14/13  8:00 PM      Result Value Range   Squamous Epithelial / LPF RARE  RARE   WBC, UA 0-2  <3 WBC/hpf   RBC / HPF 3-6  <3 RBC/hpf   Bacteria, UA FEW (*) RARE  CBC     Status: Abnormal   Collection Time    12/14/13  8:25 PM      Result Value Range   WBC 6.7  4.0 - 10.5 K/uL   RBC 4.08  3.87 - 5.11 MIL/uL   Hemoglobin 12.9  12.0 - 15.0 g/dL   HCT 40.4  36.0 - 46.0 %   MCV 99.0  78.0 - 100.0 fL   MCH 31.6  26.0 - 34.0 pg   MCHC 31.9  30.0 - 36.0 g/dL   RDW 15.8 (*) 11.5 - 15.5 %   Platelets 191  150  - 400 K/uL  POCT I-STAT TROPONIN I     Status: Abnormal   Collection Time    12/14/13  8:58 PM      Result Value Range   Troponin i, poc 1.03 (*) 0.00 - 0.08 ng/mL   Comment NOTIFIED PHYSICIAN     Comment 3           TROPONIN I     Status: Abnormal   Collection Time    12/14/13 10:00 PM      Result Value Range   Troponin I 1.54 (*) <0.30 ng/mL  COMPREHENSIVE METABOLIC PANEL     Status: Abnormal   Collection Time    12/14/13 10:00 PM      Result Value Range   Sodium 145  137 -   147 mEq/L   Potassium 5.8 (*) 3.7 - 5.3 mEq/L   Chloride 109  96 - 112 mEq/L   CO2 24  19 - 32 mEq/L   Glucose, Bld 100 (*) 70 - 99 mg/dL   BUN 14  6 - 23 mg/dL   Creatinine, Ser 0.74  0.50 - 1.10 mg/dL   Calcium 8.6  8.4 - 10.5 mg/dL   Total Protein 6.8  6.0 - 8.3 g/dL   Albumin 3.5  3.5 - 5.2 g/dL   AST 59 (*) 0 - 37 U/L   ALT 24  0 - 35 U/L   Alkaline Phosphatase 34 (*) 39 - 117 U/L   Total Bilirubin 1.0  0.3 - 1.2 mg/dL   GFR calc non Af Amer 81 (*) >90 mL/min   GFR calc Af Amer >90  >90 mL/min  MRSA PCR SCREENING     Status: None   Collection Time    12/15/13 12:25 AM      Result Value Range   MRSA by PCR NEGATIVE  NEGATIVE  TROPONIN I     Status: Abnormal   Collection Time    12/15/13  1:32 AM      Result Value Range   Troponin I 5.68 (*) <0.30 ng/mL  MAGNESIUM     Status: None   Collection Time    12/15/13  1:32 AM      Result Value Range   Magnesium 2.1  1.5 - 2.5 mg/dL  CBC     Status: Abnormal   Collection Time    12/15/13  1:32 AM      Result Value Range   WBC 6.0  4.0 - 10.5 K/uL   RBC 3.64 (*) 3.87 - 5.11 MIL/uL   Hemoglobin 11.3 (*) 12.0 - 15.0 g/dL   HCT 36.1  36.0 - 46.0 %   MCV 99.2  78.0 - 100.0 fL   MCH 31.0  26.0 - 34.0 pg   MCHC 31.3  30.0 - 36.0 g/dL   RDW 15.9 (*) 11.5 - 15.5 %   Platelets 179  150 - 400 K/uL  BASIC METABOLIC PANEL     Status: Abnormal   Collection Time    12/15/13  1:32 AM      Result Value Range   Sodium 145  137 - 147 mEq/L   Potassium  4.3  3.7 - 5.3 mEq/L   Chloride 108  96 - 112 mEq/L   CO2 30  19 - 32 mEq/L   Glucose, Bld 162 (*) 70 - 99 mg/dL   BUN 14  6 - 23 mg/dL   Creatinine, Ser 0.93  0.50 - 1.10 mg/dL   Calcium 8.2 (*) 8.4 - 10.5 mg/dL   GFR calc non Af Amer 59 (*) >90 mL/min   GFR calc Af Amer 68 (*) >90 mL/min  PROTIME-INR     Status: None   Collection Time    12/15/13  1:32 AM      Result Value Range   Prothrombin Time 14.6  11.6 - 15.2 seconds   INR 1.16  0.00 - 1.49  TROPONIN I     Status: Abnormal   Collection Time    12/15/13  4:40 AM      Result Value Range   Troponin I 4.51 (*) <0.30 ng/mL    Intake/Output Summary (Last 24 hours) at 12/15/13 1110 Last data filed at 12/15/13 1100  Gross per 24 hour  Intake    500 ml  Output      650 ml  Net   -150 ml    EKG:   NSR, rate 62, axis WNL, QTc prolonged.  Twave inversion in the inferior and later leads.  Increased from previous.    ASSESSMENT AND PLAN:  ELEVATED TROPONIN:  She will need a cadiac cath.  I discussed this with her and I called her daughter.  The patient understands that risks included but are not limited to stroke (1 in 1000), death (1 in 1000), kidney failure [usually temporary] (1 in 500), bleeding (1 in 200), allergic reaction [possibly serious] (1 in 200).  The patient understands and agrees to proceed.   DM:  This has been borderline.  A1C only 6.1.  Diet control  HTN:    BP OK.    SYNCOPE:  The etiology of this was not clear.  Continue with workup above.  Echo pending.   [name] 12/15/2013 11:10 AM   

## 2013-12-15 NOTE — CV Procedure (Signed)
    Cardiac Catheterization Procedure Note  Name: [name] MRN: [id number] DOB: 1936-12-11  Procedure: Left Heart Cath, Selective Coronary Angiography, LV angiography  Indication: 77 yo BF with presyncope and elevated cardiac troponin levels.   Procedural Details: The right wrist was prepped, draped, and anesthetized with 1% lidocaine. Using the modified Seldinger technique, a 5 French sheath was introduced into the right radial artery. 3 mg of verapamil was administered through the sheath, weight-based unfractionated heparin was administered intravenously. Standard Judkins catheters were used for selective coronary angiography and left ventriculography. Catheter exchanges were performed over an exchange length guidewire. There were no immediate procedural complications. A TR band was used for radial hemostasis at the completion of the procedure.  The patient was transferred to the post catheterization recovery area for further monitoring.  Procedural Findings: Hemodynamics: AO 115/67 mean 85 mm Hg LV 107/17 mm Hg  Coronary angiography: Coronary dominance: right  Left mainstem: Normal   Left anterior descending (LAD): Mild calcification with 20% mid vessel stenosis.  Left circumflex (LCx): Normal.  Right coronary artery (RCA): Mild diffuse irregularities less than 10-20%.  Left ventriculography: Left ventricular systolic function is normal, LVEF is estimated at 55-65%, there is no significant mitral regurgitation   Final Conclusions:   1. Minimal nonobstructive CAD 2. Normal LV function.  Recommendations: Check Echo   Peter [place] MD,FACC  12/15/2013, 1:56 PM

## 2013-12-16 ENCOUNTER — Encounter (HOSPITAL_COMMUNITY): Payer: Self-pay | Admitting: Nurse Practitioner

## 2013-12-16 DIAGNOSIS — I10 Essential (primary) hypertension: Secondary | ICD-10-CM

## 2013-12-16 DIAGNOSIS — D869 Sarcoidosis, unspecified: Secondary | ICD-10-CM

## 2013-12-16 DIAGNOSIS — I214 Non-ST elevation (NSTEMI) myocardial infarction: Secondary | ICD-10-CM

## 2013-12-16 LAB — CBC
HCT: 35 % — ABNORMAL LOW (ref 36.0–46.0)
Hemoglobin: 10.8 g/dL — ABNORMAL LOW (ref 12.0–15.0)
MCH: 30.9 pg (ref 26.0–34.0)
MCHC: 30.9 g/dL (ref 30.0–36.0)
MCV: 100 fL (ref 78.0–100.0)
Platelets: 155 10*3/uL (ref 150–400)
RBC: 3.5 MIL/uL — ABNORMAL LOW (ref 3.87–5.11)
RDW: 15.9 % — ABNORMAL HIGH (ref 11.5–15.5)
WBC: 3.8 10*3/uL — ABNORMAL LOW (ref 4.0–10.5)

## 2013-12-16 LAB — GLUCOSE, CAPILLARY
GLUCOSE-CAPILLARY: 89 mg/dL (ref 70–99)
Glucose-Capillary: 82 mg/dL (ref 70–99)

## 2013-12-16 MED ORDER — METOPROLOL TARTRATE 25 MG PO TABS: 12.5000 mg | ORAL_TABLET | Freq: Two times a day (BID) | ORAL | Status: AC

## 2013-12-16 MED ORDER — NITROGLYCERIN 0.4 MG SL SUBL: 0.4000 mg | SUBLINGUAL_TABLET | SUBLINGUAL | Status: AC | PRN

## 2013-12-16 MED ORDER — ASPIRIN EC 81 MG PO TBEC: 81.0000 mg | DELAYED_RELEASE_TABLET | Freq: Every day | ORAL | Status: AC

## 2013-12-16 MED ORDER — ATORVASTATIN CALCIUM 10 MG PO TABS: 10.0000 mg | ORAL_TABLET | Freq: Every day | ORAL | Status: AC

## 2013-12-16 NOTE — Progress Notes (Signed)
TR BAND REMOVAL  LOCATION:    right radial  DEFLATED PER PROTOCOL:    yes  TIME BAND OFF / DRESSING APPLIED:    20:00   SITE UPON ARRIVAL:    Level 0  SITE AFTER BAND REMOVAL:    Level 0  REVERSE ALLEN'S TEST:       CIRCULATION SENSATION AND MOVEMENT:    Within Normal Limits   yes  COMMENTS:   Pt tolerated removal of TR band without complications, will continue to monitor patient 

## 2013-12-16 NOTE — Discharge Summary (Signed)
Discharge Summary   Patient ID: [name],  MRN: [id number], DOB/AGE: July 29, 1936 78 y.o.  Admit date: 12/14/2013 Discharge date: 12/16/2013  Primary Care Provider: Rene PaciValerie Leschber Primary Cardiologist: J. Hochrein, MD   Discharge Diagnoses Principal Problem:   ACS (acute coronary syndrome)  **Elevated troponin with non-obstructive CAD and normal LV function on catheterization this admission.  Active Problems:   Sarcoidosis   HYPERTENSION   DIABETES MELLITUS, TYPE II, BORDERLINE   GERD   OSTEOPENIA   Osteoarthritis  Allergies No Known Allergies  Procedures  Cardiac Catheterization 12.31.2014  Procedural Findings: Hemodynamics: AO 115/67 mean 85 mm Hg LV 107/17 mm Hg  Coronary angiography: Coronary dominance: right  Left mainstem: Normal   Left anterior descending (LAD): Mild calcification with 20% mid vessel stenosis. Left circumflex (LCx): Normal. Right coronary artery (RCA): Mild diffuse irregularities less than 10-20%. Left ventriculography: Left ventricular systolic function is normal, LVEF is estimated at 55-65%, there is no significant mitral regurgitation   Final Conclusions:   1. Minimal nonobstructive CAD 2. Normal LV function. _____________  History of Present Illness  78 y/o female with h/o HTN, borderline DM, sarcoidosis (skin/lungs) on chronic methotrexate, prednisone, and oxygen therapy, who was in her usual state of health until the evening of 12/14/2013, when she was sitting on a stool at her home and "slipped off" without losing consciousness.  She developed dizziness once she was on the floor and this prevented her from getting up for about 10-15 minutes.  There was no trauma, focal weakness, or slurring of speech.  Her family called EMS and [facility] where ECG showed somewhat diffuse ST depression and twi, and point of care troponin was elevated at 1.03.  She was admitted for further evaluation and management of  possible NSTEMI.  Hospital Course  Following admission, troponin rose to 5.68.  At no point did she have chest pain.  She has some degree of chronic dyspnea and this was not any worse than baseline.  Decision was made to pursue diagnostic catheterization on 12/15/2013, and this revealed mild non-obstructive CAD with normal LV function.  Troponins have trended down to 3.99.  She remains asymptomatic and hemodynamically stable.  There is no clear etiology for her troponin elevation.  We plan to discharge her home today in good condition and will arrange for outpatient echocardiography.  Discharge Vitals Blood pressure 138/88, pulse 61, temperature 98.2 F (36.8 C), temperature source Oral, resp. rate 20, height 5\' 7"  (1.702 m), weight 201 lb 8 oz (91.4 kg), SpO2 96.00%.  Filed Weights   12/15/13 0026 12/16/13 0600  Weight: 194 lb 0.1 oz (88 kg) 201 lb 8 oz (91.4 kg)   Labs  CBC  Recent Labs  12/15/13 1510 12/16/13 0400  WBC 3.4* 3.8*  HGB 12.9 10.8*  HCT 41.6 35.0*  MCV 101.2* 100.0  PLT 161 155   Basic Metabolic Panel  Recent Labs  12/14/13 2200 12/15/13 0132 12/15/13 1510  NA 145 145  --   K 5.8* 4.3  --   CL 109 108  --   CO2 24 30  --   GLUCOSE 100* 162*  --   BUN 14 14  --   CREATININE 0.74 0.93 0.78  CALCIUM 8.6 8.2*  --   MG  --  2.1  --    Liver Function Tests  Recent Labs  12/14/13 2200  AST 59*  ALT 24  ALKPHOS 34*  BILITOT 1.0  PROT 6.8  ALBUMIN  3.5   Cardiac Enzymes  Recent Labs  12/15/13 0132 12/15/13 0440 12/15/13 1157  TROPONINI 5.68* 4.51* 3.99*   Disposition  Pt is being discharged home today in good condition.  Follow-up Plans & Appointments  Follow-up Information   Follow up with Rene Paci, MD. (as scheduled.)    Specialty:  Internal Medicine   Contact information:   [address] [place] [address] [phone]       Follow up with Oretha Milch., MD. (as scheduled)    Specialty:   Pulmonary Disease   Contact information:   [address] [place] [address] [phone]       Follow up with Rollene Rotunda, MD. (~ 2 wks.  We will arrange follow-up with PA and heart ultrasound (echocardiogram) and contact you.)    Specialty:  Cardiology   Contact information:   [address] [place] [address] [phone]      Discharge Medications    Medication List         aspirin EC 81 MG tablet  Take 1 tablet (81 mg total) by mouth Hayes.     atorvastatin 10 MG tablet  Commonly known as:  LIPITOR  Take 1 tablet (10 mg total) by mouth Hayes at 6 PM.     BLUE-EMU MAXIMUM STRENGTH 2.5 % Liqd  Generic drug:  Menthol (Topical Analgesic)  Apply 1 application topically Hayes as needed (for k nee pain).     CENTRUM SILVER PO  Take 1 tablet by mouth Hayes.     folic acid 1 MG tablet  Commonly known as:  FOLVITE  Take 1 tablet (1 mg total) by mouth Hayes.     hydrochlorothiazide 12.5 MG capsule  Commonly known as:  MICROZIDE  Take 12.5 mg by mouth Hayes as needed (for BP).     losartan 25 MG tablet  Commonly known as:  COZAAR  Take 1 tablet (25 mg total) by mouth Hayes.     methotrexate 2.5 MG tablet  Commonly known as:  RHEUMATREX  Take 10 mg by mouth once a week. On Mondays     metoprolol tartrate 25 MG tablet  Commonly known as:  LOPRESSOR  Take 0.5 tablets (12.5 mg total) by mouth 2 (two) times Hayes.     nitroGLYCERIN 0.4 MG SL tablet  Commonly known as:  NITROSTAT  Place 1 tablet (0.4 mg total) under the tongue every 5 (five) minutes x 3 doses as needed for chest pain.     predniSONE 5 MG tablet  Commonly known as:  DELTASONE  Take 5 mg by mouth every other day.     traMADol 50 MG tablet  Commonly known as:  ULTRAM  Take 1 tablet (50 mg total) by mouth every 8 (eight) hours as needed for moderate pain or severe pain.     Vitamin D 2000 UNITS Caps  Take 1 capsule by mouth Hayes.       Outstanding  Labs/Studies  2D echocardiogram will be arranged to be performed as an outpatient.  Duration of Discharge Encounter   Greater than 30 minutes including physician time.  Signed, Nicolasa Ducking NP 12/16/2013, 7:23 AM

## 2013-12-16 NOTE — Discharge Summary (Signed)
Patient seen and examined and history reviewed. Agree with above findings and plan. See my earlier rounding note.  Theron Aristaeter JordanMD 12/16/2013 8:24 AM

## 2013-12-16 NOTE — Discharge Instructions (Signed)
**  PLEASE REMEMBER TO BRING ALL OF YOUR MEDICATIONS TO EACH OF YOUR FOLLOW-UP OFFICE VISITS.  Radial Site Care Refer to this sheet in the next few weeks. These instructions provide you with information on caring for yourself after your procedure. Your caregiver may also give you more specific instructions. Your treatment has been planned according to current medical practices, but problems sometimes occur. Call your caregiver if you have any problems or questions after your procedure. HOME CARE INSTRUCTIONS  You may shower the day after the procedure.Remove the bandage (dressing) and gently wash the site with plain soap and water.Gently pat the site dry.   Do not apply powder or lotion to the site.   Do not submerge the affected site in water for 3 to 5 days.   Inspect the site at least twice daily.   Do not flex or bend the affected arm for 24 hours.   No lifting over 5 pounds (2.3 kg) for 5 days after your procedure.    What to expect:  Any bruising will usually fade within 1 to 2 weeks.   Blood that collects in the tissue (hematoma) may be painful to the touch. It should usually decrease in size and tenderness within 1 to 2 weeks.  SEEK IMMEDIATE MEDICAL CARE IF:  You have unusual pain at the radial site.   You have redness, warmth, swelling, or pain at the radial site.   You have drainage (other than a small amount of blood on the dressing).   You have chills.   You have a fever or persistent symptoms for more than 72 hours.   You have a fever and your symptoms suddenly get worse.   Your arm becomes pale, cool, tingly, or numb.   You have heavy bleeding from the site. Hold pressure on the site.

## 2013-12-16 NOTE — Progress Notes (Signed)
Patient Name: Desiree LikensMargaret A Hayes Date of Encounter: 12/16/2013   Principal Problem:   ACS (acute coronary syndrome) Active Problems:   Sarcoidosis   HYPERTENSION   DIABETES MELLITUS, TYPE II, BORDERLINE   GERD   OSTEOPENIA   Osteoarthritis    SUBJECTIVE  No chest pain.  Stable, baseline sob.  Eager to go home.  CURRENT MEDS . atorvastatin  80 mg Oral q1800  . cholecalciferol  1,000 Units Oral Daily  . enoxaparin (LOVENOX) injection  40 mg Subcutaneous Q24H  . folic acid  1 mg Oral Daily  . losartan  25 mg Oral Daily  . metoprolol tartrate  12.5 mg Oral BID  . predniSONE  5 mg Oral QODAY    OBJECTIVE  Filed Vitals:   12/15/13 2016 12/15/13 2317 12/16/13 0422 12/16/13 0600  BP: 131/60 109/77 138/88   Pulse: 69 69 61   Temp: 97.7 F (36.5 C) 97.7 F (36.5 C) 98.2 F (36.8 C)   TempSrc: Oral Oral Oral   Resp: 20 20 20    Height:      Weight:    201 lb 8 oz (91.4 kg)  SpO2: 94% 93% 96%     Intake/Output Summary (Last 24 hours) at 12/16/13 0701 Last data filed at 12/15/13 1838  Gross per 24 hour  Intake    736 ml  Output    450 ml  Net    286 ml   Filed Weights   12/15/13 0026 12/16/13 0600  Weight: 194 lb 0.1 oz (88 kg) 201 lb 8 oz (91.4 kg)    PHYSICAL EXAM  General: Pleasant, NAD. Neuro: Alert and oriented X 3. Moves all extremities spontaneously. Psych: Normal affect. HEENT:  Normal  Neck: Supple without bruits or JVD. Lungs:  Resp regular and unlabored, crackles throughout bilat. Heart: RRR no s3, s4, or murmurs. Abdomen: Soft, non-tender, non-distended, BS + x 4.  Extremities: No clubbing, cyanosis or edema. DP/PT/Radials 2+ and equal bilaterally.  R wrist w/o bleeding/bruit/hematoma.  Accessory Clinical Findings  CBC  Recent Labs  12/15/13 1510 12/16/13 0400  WBC 3.4* 3.8*  HGB 12.9 10.8*  HCT 41.6 35.0*  MCV 101.2* 100.0  PLT 161 155   Basic Metabolic Panel  Recent Labs  12/14/13 2200 12/15/13 0132 12/15/13 1510  NA 145 145   --   K 5.8* 4.3  --   CL 109 108  --   CO2 24 30  --   GLUCOSE 100* 162*  --   BUN 14 14  --   CREATININE 0.74 0.93 0.78  CALCIUM 8.6 8.2*  --   MG  --  2.1  --    Liver Function Tests  Recent Labs  12/14/13 2200  AST 59*  ALT 24  ALKPHOS 34*  BILITOT 1.0  PROT 6.8  ALBUMIN 3.5   Cardiac Enzymes  Recent Labs  12/15/13 0132 12/15/13 0440 12/15/13 1157  TROPONINI 5.68* 4.51* 3.99*   Lab Results  Component Value Date   CHOL 131 08/11/2013   HDL 42.50 08/11/2013   LDLCALC 76 08/11/2013   TRIG 65.0 08/11/2013   CHOLHDL 3 08/11/2013    TELE  rsr  Radiology/Studies  No results found.  ASSESSMENT AND PLAN  1. ACS/Elevated troponin:  S/p cath revealing nonobstructive CAD and normal LV fxn.  Etiology of troponin elevation not clear - ? Some degree of chronic elevation.  Add ASA 81.  Cont low-dose bb.  LDL 76.  Reduce lipitor to 10.  Ambulate this AM  with plan for d/c.  Plan echo as an outpt.  2.  HTN: stable.  3.  Lipids:  TC 131, LDL 76.  Previously statin naive.  Cont low-dose statin in setting of nonobs cad.  4.  Sarcoidosis:  Cont prednisone.  F/u 2D echo as an outpt -? Infiltrative cardiac dzs.  5.  Borderline DM:  Diet controlled.  Signed, Nicolasa Ducking NP Patient seen and examined and history reviewed. Agree with above findings and plan. Feels well today. No chest pain or lightheadedness. Chronic SOB is at baseline.  Etiology of elevated troponins is unclear. No clinical symptoms of angina. Will continue low dose beta blocker and ASA 81 mg daily. Reduce lipitor to 10 mg given age and comorbidities. Will need Echo to complete cardiac workup. If not done this am will do as outpatient.  Theron Arista [facility] 12/16/2013 7:29 AM

## 2013-12-19 NOTE — ED Provider Notes (Signed)
I saw and evaluated the patient, reviewed the resident's note and I agree with the findings, including EKG interpretation, and plan.  Patient presents to the ER for evaluation of near syncope and generalized weakness. Although she was not experiencing any chest pain, EKG performed on arrival was concerning for inferior and lateral ischemia. Troponin was elevated and patient admitted for acute coronary syndrome.   Gilda Creasehristopher J. Cynara Tatham, MD 12/19/13 [phone]

## 2013-12-22 ENCOUNTER — Telehealth: Payer: Self-pay | Admitting: Cardiology

## 2013-12-22 NOTE — Telephone Encounter (Signed)
New message   Per after hour voice mail on  12/31 . Per Di Kindlehris B  PA .   Call patient to make an appt  .   Need echo & 2 weeks tcm .     Patient inform that she will wait and contact her pcp before making an appt

## 2013-12-22 NOTE — Telephone Encounter (Signed)
noted 

## 2013-12-24 ENCOUNTER — Other Ambulatory Visit: Payer: Self-pay | Admitting: Pulmonary Disease

## 2014-01-03 ENCOUNTER — Other Ambulatory Visit: Payer: Self-pay | Admitting: Pulmonary Disease

## 2014-01-10 ENCOUNTER — Telehealth: Payer: Self-pay | Admitting: Internal Medicine

## 2014-01-10 NOTE — Telephone Encounter (Signed)
Relevant patient education mailed to patient.  

## 2014-01-16 ENCOUNTER — Other Ambulatory Visit: Payer: Self-pay | Admitting: Pulmonary Disease

## 2014-01-17 ENCOUNTER — Encounter (HOSPITAL_COMMUNITY): Payer: Self-pay | Admitting: Emergency Medicine

## 2014-01-17 ENCOUNTER — Inpatient Hospital Stay (HOSPITAL_COMMUNITY)
Admission: EM | Admit: 2014-01-17 | Discharge: 2014-01-21 | DRG: 196 | Disposition: A | Discharge status: Home Health | Payer: Medicare Other | Attending: Internal Medicine | Admitting: Internal Medicine

## 2014-01-17 ENCOUNTER — Emergency Department (HOSPITAL_COMMUNITY): Payer: Medicare Other

## 2014-01-17 DIAGNOSIS — R7309 Other abnormal glucose: Secondary | ICD-10-CM

## 2014-01-17 DIAGNOSIS — J440 Chronic obstructive pulmonary disease with acute lower respiratory infection: Secondary | ICD-10-CM

## 2014-01-17 DIAGNOSIS — I249 Acute ischemic heart disease, unspecified: Secondary | ICD-10-CM

## 2014-01-17 DIAGNOSIS — J99 Respiratory disorders in diseases classified elsewhere: Secondary | ICD-10-CM | POA: Diagnosis present

## 2014-01-17 DIAGNOSIS — I10 Essential (primary) hypertension: Secondary | ICD-10-CM

## 2014-01-17 DIAGNOSIS — J309 Allergic rhinitis, unspecified: Secondary | ICD-10-CM

## 2014-01-17 DIAGNOSIS — J9690 Respiratory failure, unspecified, unspecified whether with hypoxia or hypercapnia: Secondary | ICD-10-CM

## 2014-01-17 DIAGNOSIS — J9610 Chronic respiratory failure, unspecified whether with hypoxia or hypercapnia: Secondary | ICD-10-CM | POA: Diagnosis present

## 2014-01-17 DIAGNOSIS — R269 Unspecified abnormalities of gait and mobility: Secondary | ICD-10-CM

## 2014-01-17 DIAGNOSIS — Z7982 Long term (current) use of aspirin: Secondary | ICD-10-CM

## 2014-01-17 DIAGNOSIS — E876 Hypokalemia: Secondary | ICD-10-CM | POA: Diagnosis present

## 2014-01-17 DIAGNOSIS — E119 Type 2 diabetes mellitus without complications: Secondary | ICD-10-CM | POA: Diagnosis present

## 2014-01-17 DIAGNOSIS — M949 Disorder of cartilage, unspecified: Secondary | ICD-10-CM

## 2014-01-17 DIAGNOSIS — R509 Fever, unspecified: Secondary | ICD-10-CM

## 2014-01-17 DIAGNOSIS — Z808 Family history of malignant neoplasm of other organs or systems: Secondary | ICD-10-CM

## 2014-01-17 DIAGNOSIS — D899 Disorder involving the immune mechanism, unspecified: Secondary | ICD-10-CM | POA: Diagnosis present

## 2014-01-17 DIAGNOSIS — R5383 Other fatigue: Secondary | ICD-10-CM

## 2014-01-17 DIAGNOSIS — I2789 Other specified pulmonary heart diseases: Secondary | ICD-10-CM | POA: Diagnosis present

## 2014-01-17 DIAGNOSIS — Z87891 Personal history of nicotine dependence: Secondary | ICD-10-CM

## 2014-01-17 DIAGNOSIS — J9600 Acute respiratory failure, unspecified whether with hypoxia or hypercapnia: Secondary | ICD-10-CM | POA: Diagnosis present

## 2014-01-17 DIAGNOSIS — R0902 Hypoxemia: Secondary | ICD-10-CM

## 2014-01-17 DIAGNOSIS — Z96659 Presence of unspecified artificial knee joint: Secondary | ICD-10-CM

## 2014-01-17 DIAGNOSIS — I252 Old myocardial infarction: Secondary | ICD-10-CM

## 2014-01-17 DIAGNOSIS — D869 Sarcoidosis, unspecified: Principal | ICD-10-CM

## 2014-01-17 DIAGNOSIS — M1990 Unspecified osteoarthritis, unspecified site: Secondary | ICD-10-CM

## 2014-01-17 DIAGNOSIS — R5381 Other malaise: Secondary | ICD-10-CM | POA: Diagnosis present

## 2014-01-17 DIAGNOSIS — J189 Pneumonia, unspecified organism: Secondary | ICD-10-CM

## 2014-01-17 DIAGNOSIS — IMO0002 Reserved for concepts with insufficient information to code with codable children: Secondary | ICD-10-CM

## 2014-01-17 DIAGNOSIS — K219 Gastro-esophageal reflux disease without esophagitis: Secondary | ICD-10-CM

## 2014-01-17 DIAGNOSIS — E559 Vitamin D deficiency, unspecified: Secondary | ICD-10-CM

## 2014-01-17 DIAGNOSIS — I2510 Atherosclerotic heart disease of native coronary artery without angina pectoris: Secondary | ICD-10-CM | POA: Diagnosis present

## 2014-01-17 DIAGNOSIS — M899 Disorder of bone, unspecified: Secondary | ICD-10-CM

## 2014-01-17 DIAGNOSIS — J209 Acute bronchitis, unspecified: Secondary | ICD-10-CM

## 2014-01-17 LAB — URINALYSIS W MICROSCOPIC + REFLEX CULTURE
GLUCOSE, UA: NEGATIVE mg/dL
KETONES UR: 15 mg/dL — AB
LEUKOCYTES UA: NEGATIVE
NITRITE: NEGATIVE
PH: 6.5 (ref 5.0–8.0)
Protein, ur: 100 mg/dL — AB
Specific Gravity, Urine: 1.025 (ref 1.005–1.030)
Urobilinogen, UA: 2 mg/dL — ABNORMAL HIGH (ref 0.0–1.0)

## 2014-01-17 LAB — COMPREHENSIVE METABOLIC PANEL
ALBUMIN: 3.7 g/dL (ref 3.5–5.2)
ALT: 16 U/L (ref 0–35)
AST: 21 U/L (ref 0–37)
Alkaline Phosphatase: 45 U/L (ref 39–117)
BUN: 11 mg/dL (ref 6–23)
CO2: 28 mEq/L (ref 19–32)
Calcium: 8.7 mg/dL (ref 8.4–10.5)
Chloride: 102 mEq/L (ref 96–112)
Creatinine, Ser: 0.84 mg/dL (ref 0.50–1.10)
GFR calc Af Amer: 77 mL/min — ABNORMAL LOW (ref >90)
GFR calc non Af Amer: 66 mL/min — ABNORMAL LOW (ref >90)
Glucose, Bld: 113 mg/dL — ABNORMAL HIGH (ref 70–99)
POTASSIUM: 3.9 meq/L (ref 3.7–5.3)
SODIUM: 142 meq/L (ref 137–147)
Total Bilirubin: 3.2 mg/dL — ABNORMAL HIGH (ref 0.3–1.2)
Total Protein: 7.1 g/dL (ref 6.0–8.3)

## 2014-01-17 LAB — CBC WITH DIFFERENTIAL/PLATELET
BASOS PCT: 2 % — AB (ref 0–1)
Basophils Absolute: 0.1 10*3/uL (ref 0.0–0.1)
EOS PCT: 0 % (ref 0–5)
Eosinophils Absolute: 0 10*3/uL (ref 0.0–0.7)
HCT: 41.4 % (ref 36.0–46.0)
Hemoglobin: 13.4 g/dL (ref 12.0–15.0)
Lymphocytes Relative: 9 % — ABNORMAL LOW (ref 12–46)
Lymphs Abs: 0.3 10*3/uL — ABNORMAL LOW (ref 0.7–4.0)
MCH: 31.2 pg (ref 26.0–34.0)
MCHC: 32.4 g/dL (ref 30.0–36.0)
MCV: 96.5 fL (ref 78.0–100.0)
Monocytes Absolute: 0.2 10*3/uL (ref 0.1–1.0)
Monocytes Relative: 5 % (ref 3–12)
NEUTROS PCT: 84 % — AB (ref 43–77)
Neutro Abs: 3 10*3/uL (ref 1.7–7.7)
Platelets: 124 10*3/uL — ABNORMAL LOW (ref 150–400)
RBC: 4.29 MIL/uL (ref 3.87–5.11)
RDW: 15.8 % — AB (ref 11.5–15.5)
WBC: 3.6 10*3/uL — ABNORMAL LOW (ref 4.0–10.5)

## 2014-01-17 LAB — POCT I-STAT TROPONIN I: TROPONIN I, POC: 0.09 ng/mL — AB (ref 0.00–0.08)

## 2014-01-17 LAB — GLUCOSE, CAPILLARY: Glucose-Capillary: 160 mg/dL — ABNORMAL HIGH (ref 70–99)

## 2014-01-17 LAB — CG4 I-STAT (LACTIC ACID): LACTIC ACID, VENOUS: 1.8 mmol/L (ref 0.5–2.2)

## 2014-01-17 MED ORDER — METOPROLOL TARTRATE 12.5 MG HALF TABLET
12.5000 mg | ORAL_TABLET | Freq: Two times a day (BID) | ORAL | Status: DC
  Administered 2014-01-17 – 2014-01-21 (×8): 12.5 mg via ORAL
  Filled 2014-01-17 (×9): qty 1

## 2014-01-17 MED ORDER — HEPARIN SODIUM (PORCINE) 5000 UNIT/ML IJ SOLN
5000.0000 [IU] | Freq: Three times a day (TID) | INTRAMUSCULAR | Status: DC
  Administered 2014-01-18 – 2014-01-21 (×11): 5000 [IU] via SUBCUTANEOUS
  Filled 2014-01-17 (×13): qty 1

## 2014-01-17 MED ORDER — ATORVASTATIN CALCIUM 10 MG PO TABS
10.0000 mg | ORAL_TABLET | Freq: Every day | ORAL | Status: DC
  Administered 2014-01-18 – 2014-01-20 (×3): 10 mg via ORAL
  Filled 2014-01-17 (×4): qty 1

## 2014-01-17 MED ORDER — ASPIRIN EC 81 MG PO TBEC
81.0000 mg | DELAYED_RELEASE_TABLET | Freq: Every day | ORAL | Status: DC
  Administered 2014-01-18 – 2014-01-21 (×4): 81 mg via ORAL
  Filled 2014-01-17 (×4): qty 1

## 2014-01-17 MED ORDER — VANCOMYCIN HCL IN DEXTROSE 1-5 GM/200ML-% IV SOLN
1000.0000 mg | Freq: Two times a day (BID) | INTRAVENOUS | Status: DC
  Administered 2014-01-18 – 2014-01-19 (×3): 1000 mg via INTRAVENOUS
  Filled 2014-01-17 (×4): qty 200

## 2014-01-17 MED ORDER — FOLIC ACID 0.5 MG HALF TAB
0.5000 mg | ORAL_TABLET | Freq: Every day | ORAL | Status: DC
  Administered 2014-01-18 – 2014-01-21 (×4): 0.5 mg via ORAL
  Filled 2014-01-17 (×6): qty 1

## 2014-01-17 MED ORDER — SODIUM CHLORIDE 0.9 % IV SOLN
1000.0000 mL | Freq: Once | INTRAVENOUS | Status: AC
  Administered 2014-01-17: 1000 mL via INTRAVENOUS

## 2014-01-17 MED ORDER — DEXTROSE 5 % IV SOLN
1.0000 g | Freq: Three times a day (TID) | INTRAVENOUS | Status: DC
  Filled 2014-01-17: qty 1

## 2014-01-17 MED ORDER — DEXTROSE 5 % IV SOLN
1.0000 g | Freq: Once | INTRAVENOUS | Status: AC
  Administered 2014-01-17: 1 g via INTRAVENOUS
  Filled 2014-01-17: qty 1

## 2014-01-17 MED ORDER — ALBUTEROL (5 MG/ML) CONTINUOUS INHALATION SOLN
10.0000 mg/h | INHALATION_SOLUTION | Freq: Once | RESPIRATORY_TRACT | Status: AC
  Administered 2014-01-17: 10 mg/h via RESPIRATORY_TRACT
  Filled 2014-01-17: qty 20

## 2014-01-17 MED ORDER — PREDNISONE 5 MG PO TABS
5.0000 mg | ORAL_TABLET | Freq: Every day | ORAL | Status: DC
  Filled 2014-01-17: qty 1

## 2014-01-17 MED ORDER — METHYLPREDNISOLONE SODIUM SUCC 125 MG IJ SOLR
125.0000 mg | Freq: Once | INTRAMUSCULAR | Status: AC
Start: 2014-01-17 — End: 2014-01-17
  Administered 2014-01-17: 125 mg via INTRAVENOUS
  Filled 2014-01-17: qty 2

## 2014-01-17 MED ORDER — VANCOMYCIN HCL IN DEXTROSE 1-5 GM/200ML-% IV SOLN
1000.0000 mg | Freq: Two times a day (BID) | INTRAVENOUS | Status: DC
  Filled 2014-01-17: qty 200

## 2014-01-17 MED ORDER — INSULIN ASPART 100 UNIT/ML ~~LOC~~ SOLN: 0.0000 [IU] | Freq: Every day | SUBCUTANEOUS | Status: DC

## 2014-01-17 MED ORDER — VANCOMYCIN HCL 10 G IV SOLR
1500.0000 mg | INTRAVENOUS | Status: AC
  Administered 2014-01-17: 1500 mg via INTRAVENOUS
  Filled 2014-01-17: qty 1500

## 2014-01-17 MED ORDER — LOSARTAN POTASSIUM 25 MG PO TABS
25.0000 mg | ORAL_TABLET | Freq: Every day | ORAL | Status: DC
  Administered 2014-01-18 – 2014-01-19 (×2): 25 mg via ORAL
  Filled 2014-01-17 (×2): qty 1

## 2014-01-17 MED ORDER — INSULIN ASPART 100 UNIT/ML ~~LOC~~ SOLN
0.0000 [IU] | Freq: Three times a day (TID) | SUBCUTANEOUS | Status: DC
  Administered 2014-01-18: 5 [IU] via SUBCUTANEOUS
  Administered 2014-01-18 (×2): 3 [IU] via SUBCUTANEOUS
  Administered 2014-01-19: 2 [IU] via SUBCUTANEOUS
  Administered 2014-01-19 (×2): 3 [IU] via SUBCUTANEOUS
  Administered 2014-01-20: 5 [IU] via SUBCUTANEOUS
  Administered 2014-01-20 – 2014-01-21 (×2): 3 [IU] via SUBCUTANEOUS

## 2014-01-17 MED ORDER — SODIUM CHLORIDE 0.9 % IV BOLUS (SEPSIS): 500.0000 mL | Freq: Once | INTRAVENOUS | Status: DC

## 2014-01-17 MED ORDER — PANTOPRAZOLE SODIUM 40 MG PO TBEC
40.0000 mg | DELAYED_RELEASE_TABLET | Freq: Every day | ORAL | Status: DC
  Administered 2014-01-18 – 2014-01-21 (×4): 40 mg via ORAL
  Filled 2014-01-17 (×4): qty 1

## 2014-01-17 MED ORDER — CEFEPIME HCL 1 G IJ SOLR
1.0000 g | Freq: Three times a day (TID) | INTRAMUSCULAR | Status: DC
  Administered 2014-01-18 – 2014-01-21 (×11): 1 g via INTRAVENOUS
  Filled 2014-01-17 (×13): qty 1

## 2014-01-17 MED ORDER — ACETAMINOPHEN 325 MG PO TABS
650.0000 mg | ORAL_TABLET | Freq: Once | ORAL | Status: AC
  Administered 2014-01-17: 650 mg via ORAL
  Filled 2014-01-17: qty 2

## 2014-01-17 MED ORDER — SODIUM CHLORIDE 0.9 % IV SOLN
1000.0000 mL | INTRAVENOUS | Status: DC
  Administered 2014-01-17: 1000 mL via INTRAVENOUS

## 2014-01-17 MED ORDER — IPRATROPIUM-ALBUTEROL 0.5-2.5 (3) MG/3ML IN SOLN
3.0000 mL | RESPIRATORY_TRACT | Status: DC
  Administered 2014-01-17 – 2014-01-18 (×2): 3 mL via RESPIRATORY_TRACT
  Filled 2014-01-17 (×2): qty 3

## 2014-01-17 MED ORDER — METHYLPREDNISOLONE SODIUM SUCC 125 MG IJ SOLR
60.0000 mg | Freq: Four times a day (QID) | INTRAMUSCULAR | Status: DC
  Administered 2014-01-18: 60 mg via INTRAVENOUS
  Filled 2014-01-17 (×6): qty 0.96

## 2014-01-17 MED ORDER — IPRATROPIUM BROMIDE 0.02 % IN SOLN
0.5000 mg | Freq: Once | RESPIRATORY_TRACT | Status: AC
  Administered 2014-01-17: 0.5 mg via RESPIRATORY_TRACT
  Filled 2014-01-17: qty 2.5

## 2014-01-17 MED ORDER — ALBUTEROL SULFATE (2.5 MG/3ML) 0.083% IN NEBU: 5.0000 mg | INHALATION_SOLUTION | Freq: Once | RESPIRATORY_TRACT | Status: DC

## 2014-01-17 MED ORDER — SODIUM CHLORIDE 0.9 % IV SOLN
INTRAVENOUS | Status: DC
  Administered 2014-01-17: via INTRAVENOUS

## 2014-01-17 NOTE — H&P (Addendum)
[facility] History and Physical  [name] WJX:[id number] DOB: November 24, 1936 DOA: 01/17/2014  Referring physician:  PCP: Rene Paci, MD   Chief Complaint: Shortness of breath  HPI: [name] is a 78 y.o. female with a past medical history of sarcoidosis, on chronic steroid therapy, who was recently admitted to the cardiology service on 12/14/2013, discharged on 12/16/2013 at which time she was brought to the hospital after sustaining a syncopal event. initial lab work showed an elevated troponin of 1.03 with EKG showing ST segment depression. She was admitted for non-ST segment elevation myocardial infarction, undergoing cardiac catheterization on 12/15/2013 which revealed mild nonobstructive coronary artery disease with a normal LV function. There is no clear etiology for troponin elevation. She presents emergency department this evening with complaints of worsening shortness of breath and cough. Symptoms have progressed over the last 2 weeks, as she has had increasing cough with scant sputum production, generalized weakness, malaise, increasing greater assistance at home and having an overall functional decline. She reports subjective fevers and chills. she denies chest pain, hemoptysis, bloody stools, melena, extremity edema, orthopnea, paroxysmal nocturnal dyspnea. On presentation she was found to be in acute hypoxemic respiratory failure having a respiratory rate of 27th and placed on a 100% nonrebreather. She was also found to be febrile with a temperature 102. She was administered vancomycin and cefepime as well as Solu-Medrol.                                                                                                                                                                                                                               Review of Systems:  Constitutional:  Positive for night sweats, Fevers, chills, fatigue.  HEENT:  No headaches, Difficulty  swallowing,Tooth/dental problems,Sore throat,  No sneezing, itching, ear ache, nasal congestion, post nasal drip,  Cardio-vascular:  No chest pain, Orthopnea, PND, swelling in lower extremities, anasarca, dizziness, palpitations  GI:  No heartburn, indigestion, abdominal pain, nausea, vomiting, diarrhea, change in bowel habits, loss of appetite  Resp:  Positive for shortness of breath with exertion or at rest. No excess mucus, no productive cough, No non-productive cough, No coughing up of blood.No change in color of mucus.No wheezing.No chest wall deformity  Skin:  no rash or lesions.  GU:  no dysuria, change in color of urine, no urgency or frequency. No flank pain.  Musculoskeletal:  No joint pain or swelling. No decreased range of motion.  No back pain.  Psych:  No change in mood or affect. No depression or anxiety. No memory loss.   Past Medical History  Diagnosis Date  . Sarcoidosis     hypoxic resp failure, follows with pulm for same  . Hypertension   . Allergic rhinitis, cause unspecified   . Esophageal reflux   . Vertigo   . Unspecified vitamin D deficiency   . OSTEOPENIA   . DIABETES MELLITUS, TYPE II, BORDERLINE   . Osteoarthritis     s/p TKR x 2 right  . CAD (coronary artery disease)     a. 11/2013 elevated troponin in setting of presyncope->Cath: LM nl, LAD 1018m, LCX nl, RCA 10-20 diff, EF 55-65%.   Past Surgical History  Procedure Laterality Date  . Appendectomy    . Knee arthroscopy    . Total knee arthroplasty Right     x2   Social History:  reports that she quit smoking about 28 years ago. Her smoking use included Cigarettes. She has a 16 pack-year smoking history. She has never used smokeless tobacco. She reports that she does not drink alcohol. Her drug history is not on file.  No Known Allergies  Family History  Problem Relation Age of Onset  . Asthma Sister   . Bone cancer Mother   . Diabetes type II Sister      Prior to Admission medications    Medication Sig Start Date End Date Taking? Authorizing Provider  aspirin EC 81 MG tablet Take 1 tablet (81 mg total) by mouth daily. 12/16/13  Yes Ok Anishristopher R Berge, NP  atorvastatin (LIPITOR) 10 MG tablet Take 1 tablet (10 mg total) by mouth daily at 6 PM. 12/16/13  Yes Ok Anishristopher R Berge, NP  Cholecalciferol (VITAMIN D) 2000 UNITS CAPS Take 1 capsule by mouth daily.     Yes Historical Provider, MD  folic acid (FOLVITE) 1 MG tablet Take 0.5 mg by mouth daily after breakfast.   Yes Historical Provider, MD  hydrochlorothiazide (MICROZIDE) 12.5 MG capsule Take 12.5 mg by mouth daily as needed (for BP).   Yes Historical Provider, MD  losartan (COZAAR) 25 MG tablet Take 1 tablet (25 mg total) by mouth daily. 12/07/13  Yes Newt LukesValerie A Leschber, MD  Menthol, Topical Analgesic, (BLUE-EMU MAXIMUM STRENGTH) 2.5 % LIQD Apply 1 application topically daily as needed (for k nee pain).   Yes Historical Provider, MD  methotrexate (RHEUMATREX) 2.5 MG tablet Take 10 mg by mouth once a week. On Mondays   Yes Historical Provider, MD  metoprolol tartrate (LOPRESSOR) 25 MG tablet Take 0.5 tablets (12.5 mg total) by mouth 2 (two) times daily. 12/16/13  Yes Ok Anishristopher R Berge, NP  Multiple Vitamins-Minerals (CENTRUM SILVER PO) Take 1 tablet by mouth daily.     Yes Historical Provider, MD  nitroGLYCERIN (NITROSTAT) 0.4 MG SL tablet Place 1 tablet (0.4 mg total) under the tongue every 5 (five) minutes x 3 doses as needed for chest pain. 12/16/13  Yes Ok Anishristopher R Berge, NP  predniSONE (DELTASONE) 5 MG tablet Take 1 tablet on Mon, Wed, friday 01/04/14  Yes Oretha Milchakesh V Alva, MD  traMADol (ULTRAM) 50 MG tablet Take 1 tablet (50 mg total) by mouth every 8 (eight) hours as needed for moderate pain or severe pain. 12/07/13  Yes Newt LukesValerie A Leschber, MD   Physical Exam: Filed Vitals:   01/17/14 2021  BP: 144/89  Pulse: 89  Temp: 102 F (38.9 C)  Resp: 27    BP 144/89  Pulse 89  Temp(Src) 102 F (38.9 C) (Rectal)  Resp 27  Ht  5' 6.93" (1.7 m)  Wt 91.4 kg (201 lb 8 oz)  BMI 31.63 kg/m2  SpO2 95%  General:  Patient is ill-appearing, appears uncomfortable, restless, though she is awake alert and is following commands Eyes: PERRL, normal lids, irises & conjunctiva ENT: grossly normal hearing, dry oral mucosa Neck: no LAD, masses or thyromegaly Cardiovascular: RRR, tachycardic no m/r/g. No LE edema. Telemetry: SR, no arrhythmias  Respiratory: Patient having extensive by lateral crackles, rhonchi, expiratory wheezes and coarse respiratory sounds.  Abdomen: soft, ntnd Skin: no rash or induration seen on limited exam Musculoskeletal: grossly normal tone BUE/BLE Psychiatric: grossly normal mood and affect, speech fluent and appropriate Neurologic: Cranial nerve 2 the total grossly intact no alteration to sensation global 5 of 5 muscle strength           Labs on Admission:  Basic Metabolic Panel:  Recent Labs Lab 01/17/14 1825  NA 142  K 3.9  CL 102  CO2 28  GLUCOSE 113*  BUN 11  CREATININE 0.84  CALCIUM 8.7   Liver Function Tests:  Recent Labs Lab 01/17/14 1825  AST 21  ALT 16  ALKPHOS 45  BILITOT 3.2*  PROT 7.1  ALBUMIN 3.7   No results found for this basename: LIPASE, AMYLASE,  in the last 168 hours No results found for this basename: AMMONIA,  in the last 168 hours CBC:  Recent Labs Lab 01/17/14 1825  WBC 3.6*  NEUTROABS 3.0  HGB 13.4  HCT 41.4  MCV 96.5  PLT 124*   Cardiac Enzymes: No results found for this basename: CKTOTAL, CKMB, CKMBINDEX, TROPONINI,  in the last 168 hours  BNP (last 3 results) No results found for this basename: PROBNP,  in the last 8760 hours CBG: No results found for this basename: GLUCAP,  in the last 168 hours  Radiological Exams on Admission: Dg Chest Portable 1 View  01/17/2014   CLINICAL DATA:  Fever. Shortness of breath. Generalized weakness. Current history of sarcoidosis with chronic lung disease.  EXAM: PORTABLE CHEST - 1 VIEW  COMPARISON:   DG CHEST 2 VIEW dated 07/28/2013; DG CHEST 2 VIEW dated 03/12/2012; DG CHEST 2 VIEW dated 05/20/2011; DG CHEST 2 VIEW dated 02/13/2011; DG CHEST 2 VIEW dated 03/23/2010  FINDINGS: Chronic interstitial lung disease, not significantly changed since prior examinations. No new pulmonary parenchymal abnormalities. Cardiac silhouette moderately enlarged but stable, allowing for differences in technique. Pulmonary vascularity normal. No visible pleural effusions.  IMPRESSION: Stable severe chronic interstitial lung disease, likely fibrosis. Stable cardiomegaly without pulmonary edema. No acute cardiopulmonary disease.   Electronically Signed   By: Hulan Saas M.D.   On: 01/17/2014 18:23    EKG: Independently reviewed.   Assessment/Plan Active Problems:   HCAP (healthcare-associated pneumonia)   Respiratory failure   Sarcoidosis   HYPERTENSION   GERD   DIABETES MELLITUS, TYPE II, BORDERLINE   1. Acute hypoxemic respiratory failure, present on admission, evidenced by a respiratory of 27, patient requiring 100% non-rebreather, which could be secondary to developing healthcare associated pneumonia versus pulmonary sarcoid flare-up. Starting broad-spectrum empiric IV antibiotic therapy with cefepime and vancomycin, continue IV steroids with Solu-Medrol 60 mg IV every 6 hours, DuoNeb scheduled every 4 hours, admission to the step down unit for close monitoring. 2. Suspected healthcare associated pneumonia. Patient was recently discharged from the cardiology service on 12/16/2013. She presents with complaints of cough and shortness of breath having a temperature of 102.  Although chest x-ray did not show acute infiltrates will cover with broad-spectrum IV antibiotic therapy overnight with vancomycin and cefepime, repeat chest x-ray in the morning. Blood cultures and sputum cultures ordered, follow pneumonia protocol. 3. Possible pulmonary sarcoid flare-up. Patient presenting with extensive crackles and wheezing on  lung exam, in respiratory failure. Apart from starting empiric IV antibiotic therapy for presumed healthcare associated pneumonia, will provide IV steroids with Solu-Medrol 60 mg every 6 hours to cover for possibility of underlying sarcoid flare. 4. History of elevated troponins. Patient recently admitted to the cardiac service where she underwent heart cath for further evaluation of elevated troponins. She was found to have mild nonobstructive coronary artery disease. On today's lab work showed a troponin of 0.09. 5. Gastroesophageal reflux disease. PPI therapy 6. History of borderline type 2 diabetes mellitus. Patient to be started on IV steroids, we'll cover with sliding scale coverage with Accu-Cheks q. a.c. each bedtime 7. Hypertension. Continue metoprolol and losartan therapy. Discontinue hydrochlorothiazide for now as I suspect she may be dehydrated. 8. DVT prophylaxis. Lovenox    Code Status: Patient is a full code Family Communication: I spoke with patient's daughter present at bedside Disposition Plan: Will admit to the step down unit, I anticipate she will require greater than 2 nights hospitalization.   Time spent: 70 minutes  Addendum An ABG was ordered, showed a pH of 7.165 with PCO2 of 63.4 PO2 of 128. Patient continues to be dyspneic. I spoke with pulmonary critical care medicine, will start trial of BiPAP. Of note BNP was followed up, came back elevated at 13,392. I have discontinued IV fluids, will administer 40 mg of IV Lasix x1 now.    Jeralyn Bennett [facility] Pager [phone]

## 2014-01-17 NOTE — Progress Notes (Signed)
ANTIBIOTIC CONSULT NOTE - INITIAL  Pharmacy Consult for vancomycin/cefepime Indication: rule out pneumonia  No Known Allergies  Patient Measurements:   Adjusted Body Weight:   Vital Signs: Temp: 101.5 F (38.6 C) (02/02 1812) Temp src: Rectal (02/02 1812) BP: 177/101 mmHg (02/02 1800) Pulse Rate: 98 (02/02 1800) Intake/Output from previous day:   Intake/Output from this shift:    Labs: No results found for this basename: WBC, HGB, PLT, LABCREA, CREATININE,  in the last 72 hours The CrCl is unknown because both a height and weight (above a minimum accepted value) are required for this calculation. No results found for this basename: VANCOTROUGH, VANCOPEAK, VANCORANDOM, GENTTROUGH, GENTPEAK, GENTRANDOM, TOBRATROUGH, TOBRAPEAK, TOBRARND, AMIKACINPEAK, AMIKACINTROU, AMIKACIN,  in the last 72 hours   Microbiology: No results found for this or any previous visit (from the past 720 hour(s)).  Medical History: Past Medical History  Diagnosis Date  . Sarcoidosis     hypoxic resp failure, follows with pulm for same  . Hypertension   . Allergic rhinitis, cause unspecified   . Esophageal reflux   . Vertigo   . Unspecified vitamin D deficiency   . OSTEOPENIA   . DIABETES MELLITUS, TYPE II, BORDERLINE   . Osteoarthritis     s/p TKR x 2 right  . CAD (coronary artery disease)     a. 11/2013 elevated troponin in setting of presyncope->Cath: LM nl, LAD 3986m, LCX nl, RCA 10-20 diff, EF 55-65%.    Assessment: [age] YOF presents with fever and shortness of breath with hypoxia. She has history of sarcoidosis on chronic prednisone.  Vancomycin and cefepime ordered.    2/2 >> vancomycin  >> 2/2 >> cefepime >>    Tmax: 101.5 WBCs: 3.8 Renal: SCR = 0.84 for est CrCl = 5767ml/min (C-G) and normalized  2/2 blood: pending 2/2 urine: pending  Goal of Therapy:  Vancomycin trough level 15-20 mcg/ml  Plan:   Vancomycin 1gm IV q12h 8 days  Check vancomycin trough at steady  state  Cefepime 1gm IV q8h  Juliette Alcideustin Zeigler, PharmD, BCPS.   Pager: [phone]  01/17/2014,6:54 PM

## 2014-01-17 NOTE — ED Provider Notes (Signed)
Medical screening examination/treatment/procedure(s) were conducted as a shared visit with non-physician practitioner(s) and myself.  I personally evaluated the patient during the encounter.  EKG Interpretation   None      Fever, SOB, hypoxia, today with wheezing bilat. on exam speaks short phrases with moderate resp distress on NRB mask prior to continuous neb in ED. 1820  ECG Muse interface not available: Sinus rhythm, ventricular rate 98, right axis deviation, diffuse inverted T waves, prolonged QT interval; compared with previous ECG patient has prolonged QT interval  Desiree HornJohn M  Jon, MD 01/21/14 2016

## 2014-01-17 NOTE — ED Provider Notes (Signed)
CSN: [id number]     Arrival date & time 01/17/14  1742 History   First MD Initiated Contact with Patient 01/17/14 1802     Chief Complaint  Patient presents with  . Fever  . Shortness of Breath   (Consider location/radiation/quality/duration/timing/severity/associated sxs/prior Treatment) HPI [name] is a 78 y.o. female, with history of sarcoidosis, on 2 L home O2, coming in from home with complaint of fever, cough, shortness of breath. According to EMS, patient initially complained of shortness of breath and weakness. Patient was found to have oxygen saturation of 69% on 2 L at home. She also had a temperature 102 orally on their arrival. Patient was placed on nonrebreather and brought to the hospital. Patient complaint of dry cough for several days, generalized weakness. She has been taking her prednisone which she states she takes every other day for her sarcoidosis. She states she does not take any inhalers. She denies any other upper respiratory symptoms. She denies any pain in her chest. She denies any urinary symptoms. She states she's followed by a lumbar pulmonology.  Past Medical History  Diagnosis Date  . Sarcoidosis     hypoxic resp failure, follows with pulm for same  . Hypertension   . Allergic rhinitis, cause unspecified   . Esophageal reflux   . Vertigo   . Unspecified vitamin D deficiency   . OSTEOPENIA   . DIABETES MELLITUS, TYPE II, BORDERLINE   . Osteoarthritis     s/p TKR x 2 right  . CAD (coronary artery disease)     a. 11/2013 elevated troponin in setting of presyncope->Cath: LM nl, LAD 7563m, LCX nl, RCA 10-20 diff, EF 55-65%.   Past Surgical History  Procedure Laterality Date  . Appendectomy    . Knee arthroscopy    . Total knee arthroplasty Right     x2   Family History  Problem Relation Age of Onset  . Asthma Sister   . Bone cancer Mother   . Diabetes type II Sister    History  Substance Use Topics  . Smoking status: Former Smoker -- 0.80  packs/day for 20 years    Types: Cigarettes    Quit date: 12/16/1985  . Smokeless tobacco: Never Used  . Alcohol Use: No   OB History   Grav Para Term Preterm Abortions TAB SAB Ect Mult Living                 Review of Systems  Constitutional: Positive for fever, chills and fatigue.  Respiratory: Positive for cough, shortness of breath and wheezing. Negative for chest tightness.   Cardiovascular: Negative for chest pain, palpitations and leg swelling.  Gastrointestinal: Negative for nausea, vomiting, abdominal pain and diarrhea.  Genitourinary: Negative for dysuria, flank pain, vaginal bleeding, vaginal discharge, vaginal pain and pelvic pain.  Musculoskeletal: Negative for arthralgias, myalgias, neck pain and neck stiffness.  Skin: Negative for rash.  Neurological: Positive for weakness. Negative for dizziness and headaches.  All other systems reviewed and are negative.    Allergies  Review of patient's allergies indicates no known allergies.  Home Medications   Current Outpatient Rx  Name  Route  Sig  Dispense  Refill  . aspirin EC 81 MG tablet   Oral   Take 1 tablet (81 mg total) by mouth daily.         Marland Kitchen. atorvastatin (LIPITOR) 10 MG tablet   Oral   Take 1 tablet (10 mg total) by mouth daily at 6 PM.  30 tablet   6   . Cholecalciferol (VITAMIN D) 2000 UNITS CAPS   Oral   Take 1 capsule by mouth daily.           . folic acid (FOLVITE) 1 MG tablet   Oral   Take 0.5 mg by mouth daily after breakfast.         . hydrochlorothiazide (MICROZIDE) 12.5 MG capsule   Oral   Take 12.5 mg by mouth daily as needed (for BP).         Marland Kitchen losartan (COZAAR) 25 MG tablet   Oral   Take 1 tablet (25 mg total) by mouth daily.   30 tablet   5   . Menthol, Topical Analgesic, (BLUE-EMU MAXIMUM STRENGTH) 2.5 % LIQD   Apply externally   Apply 1 application topically daily as needed (for k nee pain).         . methotrexate (RHEUMATREX) 2.5 MG tablet   Oral   Take 10  mg by mouth once a week. On Mondays         . metoprolol tartrate (LOPRESSOR) 25 MG tablet   Oral   Take 0.5 tablets (12.5 mg total) by mouth 2 (two) times daily.   30 tablet   6   . Multiple Vitamins-Minerals (CENTRUM SILVER PO)   Oral   Take 1 tablet by mouth daily.           . nitroGLYCERIN (NITROSTAT) 0.4 MG SL tablet   Sublingual   Place 1 tablet (0.4 mg total) under the tongue every 5 (five) minutes x 3 doses as needed for chest pain.   25 tablet   3   . predniSONE (DELTASONE) 5 MG tablet      Take 1 tablet on Mon, Wed, friday   30 tablet   1   . traMADol (ULTRAM) 50 MG tablet   Oral   Take 1 tablet (50 mg total) by mouth every 8 (eight) hours as needed for moderate pain or severe pain.   40 tablet   1    BP 177/101  Pulse 98  Temp(Src) 101.5 F (38.6 C) (Rectal)  SpO2 98% Physical Exam  Nursing note and vitals reviewed. Constitutional: She appears well-developed and well-nourished. No distress.  HENT:  Head: Normocephalic.  Eyes: Conjunctivae are normal.  Neck: Neck supple.  Cardiovascular: Normal rate, regular rhythm and normal heart sounds.   Pulmonary/Chest: She has wheezes. She has no rales. She exhibits no tenderness.  Diffuse wheezes, accessory muscle use. tachypnea  Abdominal: Soft. Bowel sounds are normal. She exhibits no distension. There is no tenderness. There is no rebound.  Musculoskeletal: She exhibits no edema.  Neurological: She is alert.  Skin: Skin is warm and dry.  Psychiatric: She has a normal mood and affect. Her behavior is normal.    ED Course  Procedures (including critical care time) Labs Review Labs Reviewed  CULTURE, BLOOD (ROUTINE X 2)  CULTURE, BLOOD (ROUTINE X 2)  URINE CULTURE  CBC WITH DIFFERENTIAL  COMPREHENSIVE METABOLIC PANEL  URINALYSIS W MICROSCOPIC + REFLEX CULTURE  INFLUENZA PANEL BY PCR (TYPE A & B, H1N1)   Imaging Review Dg Chest Portable 1 View  01/17/2014   CLINICAL DATA:  Fever. Shortness of  breath. Generalized weakness. Current history of sarcoidosis with chronic lung disease.  EXAM: PORTABLE CHEST - 1 VIEW  COMPARISON:  DG CHEST 2 VIEW dated 07/28/2013; DG CHEST 2 VIEW dated 03/12/2012; DG CHEST 2 VIEW dated 05/20/2011; DG CHEST 2 VIEW  dated 02/13/2011; DG CHEST 2 VIEW dated 03/23/2010  FINDINGS: Chronic interstitial lung disease, not significantly changed since prior examinations. No new pulmonary parenchymal abnormalities. Cardiac silhouette moderately enlarged but stable, allowing for differences in technique. Pulmonary vascularity normal. No visible pleural effusions.  IMPRESSION: Stable severe chronic interstitial lung disease, likely fibrosis. Stable cardiomegaly without pulmonary edema. No acute cardiopulmonary disease.   Electronically Signed   By: Hulan Saas M.D.   On: 01/17/2014 18:23      MDM   1. Fever   2. Sarcoidosis   3. Hypoxia   4. Esophageal reflux   5. HCAP (healthcare-associated pneumonia)   6. Osteoarthritis   7. Other abnormal glucose   8. Respiratory failure     6:32 PM pt seen and examined. Pt with hypoxia, accessory muscle use on exam, diffuse wheezing. She is currently on non rebreather. Ordered steroids IV, will place on cont neb to see if symptoms improve. Discussed with Dr. Fonnie Jarvis, given fever, initial tachycardia, hypoxia, possible SIRS criteria. HCAP antibiotics ordered. Fluids ordered. Will monitor closely.   8:08 PM Patient is feeling better after neb treatment. She continues to have diffuse wheezes. Will order another neb. Her lactic acid is negative. Labs are as above. Chest x-ray did not show pneumonia. Will admit for hypoxia, fever, most likely sarcoidosis exacerbation with possible pneumonia. I discussed this with [facility] will admit. Troponin mildly bumped at 0.09 POC, will need to be repeated. ECG abnormal but unchanged from a month ago. Cardiac cath showed nonobstructive CAD 2 months ago.       Lottie Mussel,  PA-C 01/18/14 [id number]

## 2014-01-17 NOTE — Progress Notes (Signed)
Utilization Review completed.  Rajat Staver RN CM  

## 2014-01-17 NOTE — ED Notes (Signed)
Patient arrived via EMS with c/o SOB and weakness. Patient was found to have fever of 102.0. Patient is on home 02 @2L  which she was found to e at 69%. Patient now on 100% non rebreather at 96%. No med's give enroute.

## 2014-01-17 NOTE — ED Provider Notes (Deleted)
ECG Muse interface not available: Sinus rhythm, ventricular rate 70, normal axis, normal intervals, impression normal ECG, no comparison immediately available  Hurman HornJohn M Shivank Pinedo, MD 01/17/14 1941

## 2014-01-18 ENCOUNTER — Inpatient Hospital Stay (HOSPITAL_COMMUNITY): Payer: Medicare Other

## 2014-01-18 DIAGNOSIS — J440 Chronic obstructive pulmonary disease with acute lower respiratory infection: Secondary | ICD-10-CM

## 2014-01-18 DIAGNOSIS — R609 Edema, unspecified: Secondary | ICD-10-CM

## 2014-01-18 LAB — CBC
HCT: 40.7 % (ref 36.0–46.0)
Hemoglobin: 12.8 g/dL (ref 12.0–15.0)
MCH: 31.1 pg (ref 26.0–34.0)
MCHC: 31.4 g/dL (ref 30.0–36.0)
MCV: 98.8 fL (ref 78.0–100.0)
Platelets: 108 10*3/uL — ABNORMAL LOW (ref 150–400)
RBC: 4.12 MIL/uL (ref 3.87–5.11)
RDW: 16 % — AB (ref 11.5–15.5)
WBC: 3.5 10*3/uL — AB (ref 4.0–10.5)

## 2014-01-18 LAB — HEMOGLOBIN A1C
HEMOGLOBIN A1C: 5.9 % — AB (ref <5.7)
MEAN PLASMA GLUCOSE: 123 mg/dL — AB (ref <117)

## 2014-01-18 LAB — EXPECTORATED SPUTUM ASSESSMENT W GRAM STAIN, RFLX TO RESP C

## 2014-01-18 LAB — BLOOD GAS, ARTERIAL
Acid-Base Excess: 1.5 mmol/L (ref 0.0–2.0)
Acid-base deficit: 7.4 mmol/L — ABNORMAL HIGH (ref 0.0–2.0)
Acid-base deficit: 7.4 mmol/L — ABNORMAL HIGH (ref 0.0–2.0)
BICARBONATE: 20.7 meq/L (ref 20.0–24.0)
BICARBONATE: 21.9 meq/L (ref 20.0–24.0)
Bicarbonate: 27.8 mEq/L — ABNORMAL HIGH (ref 20.0–24.0)
DRAWN BY: 103701
Drawn by: 11249
Drawn by: 232811
FIO2: 0.6 %
FIO2: 1 %
O2 CONTENT: 5 L/min
O2 SAT: 97.7 %
O2 SAT: 97.8 %
O2 Saturation: 94.4 %
PATIENT TEMPERATURE: 98.6
PATIENT TEMPERATURE: 98.6
PCO2 ART: 54.2 mmHg — AB (ref 35.0–45.0)
PEEP/CPAP: 5 cmH2O
PH ART: 7.33 — AB (ref 7.350–7.450)
PO2 ART: 128 mmHg — AB (ref 80.0–100.0)
Patient temperature: 98.7
RATE: 15 resp/min
TCO2: 19.4 mmol/L (ref 0–100)
TCO2: 20.8 mmol/L (ref 0–100)
TCO2: 25.5 mmol/L (ref 0–100)
pCO2 arterial: 54.5 mmHg — ABNORMAL HIGH (ref 35.0–45.0)
pCO2 arterial: 63.4 mmHg (ref 35.0–45.0)
pH, Arterial: 7.165 — CL (ref 7.350–7.450)
pH, Arterial: 7.204 — ABNORMAL LOW (ref 7.350–7.450)
pO2, Arterial: 120 mmHg — ABNORMAL HIGH (ref 80.0–100.0)
pO2, Arterial: 70.4 mmHg — ABNORMAL LOW (ref 80.0–100.0)

## 2014-01-18 LAB — EXPECTORATED SPUTUM ASSESSMENT W REFEX TO RESP CULTURE

## 2014-01-18 LAB — URINE CULTURE
CULTURE: NO GROWTH
Colony Count: NO GROWTH

## 2014-01-18 LAB — INFLUENZA PANEL BY PCR (TYPE A & B)
H1N1FLUPCR: NOT DETECTED
Influenza A By PCR: NEGATIVE
Influenza B By PCR: NEGATIVE

## 2014-01-18 LAB — GLUCOSE, CAPILLARY
GLUCOSE-CAPILLARY: 199 mg/dL — AB (ref 70–99)
GLUCOSE-CAPILLARY: 193 mg/dL — AB (ref 70–99)
Glucose-Capillary: 248 mg/dL — ABNORMAL HIGH (ref 70–99)
Glucose-Capillary: 190 mg/dL — ABNORMAL HIGH (ref 70–99)

## 2014-01-18 LAB — LEGIONELLA ANTIGEN, URINE: LEGIONELLA ANTIGEN, URINE: NEGATIVE

## 2014-01-18 LAB — PRO B NATRIURETIC PEPTIDE: Pro B Natriuretic peptide (BNP): 13392 pg/mL — ABNORMAL HIGH (ref 0–450)

## 2014-01-18 LAB — BASIC METABOLIC PANEL
BUN: 13 mg/dL (ref 6–23)
CO2: 21 mEq/L (ref 19–32)
CREATININE: 0.81 mg/dL (ref 0.50–1.10)
Calcium: 7.5 mg/dL — ABNORMAL LOW (ref 8.4–10.5)
Chloride: 104 mEq/L (ref 96–112)
GFR calc non Af Amer: 69 mL/min — ABNORMAL LOW (ref >90)
GFR, EST AFRICAN AMERICAN: 80 mL/min — AB (ref >90)
Glucose, Bld: 241 mg/dL — ABNORMAL HIGH (ref 70–99)
Potassium: 3.6 mEq/L — ABNORMAL LOW (ref 3.7–5.3)
SODIUM: 144 meq/L (ref 137–147)

## 2014-01-18 LAB — MRSA PCR SCREENING: MRSA by PCR: NEGATIVE

## 2014-01-18 LAB — HIV ANTIBODY (ROUTINE TESTING W REFLEX): HIV: NONREACTIVE

## 2014-01-18 LAB — STREP PNEUMONIAE URINARY ANTIGEN: STREP PNEUMO URINARY ANTIGEN: NEGATIVE

## 2014-01-18 MED ORDER — IPRATROPIUM-ALBUTEROL 0.5-2.5 (3) MG/3ML IN SOLN: 3.0000 mL | RESPIRATORY_TRACT | Status: DC | PRN

## 2014-01-18 MED ORDER — LIVING WELL WITH DIABETES BOOK
Freq: Once | Status: AC
  Administered 2014-01-18: 17:00:00
  Filled 2014-01-18: qty 1

## 2014-01-18 MED ORDER — BIOTENE DRY MOUTH MT LIQD
15.0000 mL | Freq: Two times a day (BID) | OROMUCOSAL | Status: DC
  Administered 2014-01-18 – 2014-01-20 (×6): 15 mL via OROMUCOSAL

## 2014-01-18 MED ORDER — METHYLPREDNISOLONE SODIUM SUCC 125 MG IJ SOLR
80.0000 mg | Freq: Four times a day (QID) | INTRAMUSCULAR | Status: DC
  Administered 2014-01-18 – 2014-01-19 (×5): 80 mg via INTRAVENOUS
  Filled 2014-01-18 (×8): qty 1.28

## 2014-01-18 MED ORDER — FUROSEMIDE 10 MG/ML IJ SOLN
40.0000 mg | Freq: Once | INTRAMUSCULAR | Status: AC
  Administered 2014-01-18: 40 mg via INTRAVENOUS
  Filled 2014-01-18: qty 4

## 2014-01-18 MED ORDER — PREDNISONE 5 MG PO TABS: 5.0000 mg | ORAL_TABLET | ORAL | Status: DC

## 2014-01-18 MED ORDER — CHLORHEXIDINE GLUCONATE 0.12 % MT SOLN
15.0000 mL | Freq: Two times a day (BID) | OROMUCOSAL | Status: DC
  Administered 2014-01-18 – 2014-01-21 (×8): 15 mL via OROMUCOSAL
  Filled 2014-01-18 (×10): qty 15

## 2014-01-18 NOTE — Progress Notes (Addendum)
CARE MANAGEMENT NOTE 01/18/2014  Patient:  Larene PickettMAXWELL,Hanako A   Account Number:  [id number]  Date Initiated:  01/18/2014  Documentation initiated by:  DAVIS,RHONDA  Subjective/Objective Assessment:   pt admitted due to resp. failure, placed on bipap hx of sarcoidosis of the lungs     Action/Plan:   lives with her grandson at home,   Anticipated DC Date:  01/20/2014   Anticipated DC Plan:  HOME/SELF CARE  In-house referral  NA      DC Planning Services  NA      [facility]   Choice offered to / List presented to:  NA   DME arranged  NA      DME agency  NA     HH arranged  NA      HH agency  NA   Status of service:  In process, will continue to follow Medicare Important Message given?  NA - LOS <3 / Initial given by admissions (If response is "NO", the following Medicare IM given date fields will be blank) Date Medicare IM given:   Date Additional Medicare IM given:    Discharge Disposition:    Per UR Regulation:  Reviewed for med. necessity/level of care/duration of stay  If discussed at Long Length of Stay Meetings, dates discussed:    Comments:  [id number]/Rhonda Stark JockDavis, RN, BSN, [place] [phone] Chart Reviewed for discharge and hospital needs. Discharge needs at time of review:  None present will follow for needs. Review of patient progress due on [id number].

## 2014-01-18 NOTE — Progress Notes (Addendum)
[facility] PROGRESS NOTE  NKECHI LINEHAN ZOX:[id number] DOB: Jul 02, 1936 DOA: 01/17/2014 PCP: Rene Paci, MD  Brief narrative:Rubi A Padia is a 78 y.o. female with PMH of sarcoidosis, on chronic steroid therapy, who was recently discharged from [facility] on 12/16/2013 after admission for NSTEMI, she underwent cardiac catheterization on 12/15/2013 which revealed mild nonobstructive coronary artery disease with a normal LV function.  She presented to the ER with complaints of worsening shortness of breath and cough. Symptoms have progressed over the last 2 weeks, as she has had increasing cough with scant sputum production, generalized weakness, malaise On presentation she was found to be in acute hypoxemic respiratory failure having a respiratory rate of 27th and placed on a 100% nonrebreather. She was also found to be febrile with a temperature 102. Subsequently placed on BIPAP and admitted to SDU.  Assessment/Plan: 1. Acute hypoxemic respiratory failure, - which could be secondary to developing healthcare associated pneumonia versus pulmonary sarcoid flare-up.  -continue IV antibiotic therapy with cefepime and vancomycin,  -r/o Flu -restart IV steroids with Solu-Medrol 80 mg IV every 6 hours, DuoNeb scheduled every 4 hours -repeat ABG on BIPAP slightly improved, if deteriorates will need MV -PCCM following  2. ? Developing healthcare associated pneumonia - rule out FLu -FU blood Cx -Abx as above  3. Pulmonary sarcoid flare-up.  - IV steroids as noted above -nebs -DC 5mg  prednisone while on IV steroids  4. History of elevated troponins.  -Recent LHC with  mild nonobstructive coronary artery disease.  5. Gastroesophageal reflux disease. PPI therapy  6. History of borderline type 2 diabetes mellitus. -SSI for now  7. Hypertension. Continue metoprolol and losartan therapy.   DVT prophylaxis. Lovenox  Code Status: Patient is a full code  Family Communication: none  at bedside Disposition Plan: keep in SDU   Consultants:  PCCM  Antibiotics:  Vanc  Cefepime  HPI/Subjective: Sleeping now, back on BIPAP  Objective: Filed Vitals:   01/18/14 0700  BP: 135/93  Pulse: 88  Temp:   Resp: 20    Intake/Output Summary (Last 24 hours) at 01/18/14 0753 Last data filed at 01/18/14 0700  Gross per 24 hour  Intake    380 ml  Output   2295 ml  Net  -1915 ml   Filed Weights   01/17/14 1900 01/17/14 2315 01/18/14 0400  Weight: 91.4 kg (201 lb 8 oz) 91.6 kg (201 lb 15.1 oz) 88.8 kg (195 lb 12.3 oz)    Exam:   General:  Sleeping now, was awake all night, moans when aroused, when reassessed later, AAOx3  Cardiovascular: S!S2/RRR  Respiratory: scattered ronchi, prolonged exp phase  Abdomen: soft, Nt, BS present  Musculoskeletal: no edema c/c  Data Reviewed: Basic Metabolic Panel:  Recent Labs Lab 01/17/14 1825 01/18/14 0408  NA 142 144  K 3.9 3.6*  CL 102 104  CO2 28 21  GLUCOSE 113* 241*  BUN 11 13  CREATININE 0.84 0.81  CALCIUM 8.7 7.5*   Liver Function Tests:  Recent Labs Lab 01/17/14 1825  AST 21  ALT 16  ALKPHOS 45  BILITOT 3.2*  PROT 7.1  ALBUMIN 3.7   No results found for this basename: LIPASE, AMYLASE,  in the last 168 hours No results found for this basename: AMMONIA,  in the last 168 hours CBC:  Recent Labs Lab 01/17/14 1825 01/18/14 0408  WBC 3.6* 3.5*  NEUTROABS 3.0  --   HGB 13.4 12.8  HCT 41.4 40.7  MCV 96.5 98.8  PLT  124* 108*   Cardiac Enzymes: No results found for this basename: CKTOTAL, CKMB, CKMBINDEX, TROPONINI,  in the last 168 hours BNP (last 3 results)  Recent Labs  01/17/14 2325  PROBNP 13392.0*   CBG:  Recent Labs Lab 01/17/14 2339  GLUCAP 160*    Recent Results (from the past 240 hour(s))  CULTURE, BLOOD (ROUTINE X 2)     Status: None   Collection Time    01/17/14  6:25 PM      Result Value Range Status   Specimen Description BLOOD LEFT FOREARM   Final    Special Requests BOTTLES DRAWN AEROBIC AND ANAEROBIC   Final   Culture  Setup Time     Final   Value: 01/17/2014 22:18     Performed at [facility]   Culture     Final   Value:        BLOOD CULTURE RECEIVED NO GROWTH TO DATE CULTURE WILL BE HELD FOR 5 DAYS BEFORE ISSUING A FINAL NEGATIVE REPORT     Performed at [facility]   Report Status PENDING   Incomplete  CULTURE, BLOOD (ROUTINE X 2)     Status: None   Collection Time    01/17/14  6:46 PM      Result Value Range Status   Specimen Description BLOOD RIGHT ARM   Final   Special Requests BOTTLES DRAWN AEROBIC AND ANAEROBIC   Final   Culture  Setup Time     Final   Value: 01/17/2014 22:17     Performed at [facility]   Culture     Final   Value:        BLOOD CULTURE RECEIVED NO GROWTH TO DATE CULTURE WILL BE HELD FOR 5 DAYS BEFORE ISSUING A FINAL NEGATIVE REPORT     Performed at [facility]   Report Status PENDING   Incomplete  MRSA PCR SCREENING     Status: None   Collection Time    01/17/14 11:08 PM      Result Value Range Status   MRSA by PCR NEGATIVE  NEGATIVE Final   Comment:            The GeneXpert MRSA Assay (FDA     approved for NASAL specimens     only), is one component of a     comprehensive MRSA colonization     surveillance program. It is not     intended to diagnose MRSA     infection nor to guide or     monitor treatment for     MRSA infections.     Studies: Dg Chest Portable 1 View  01/17/2014   CLINICAL DATA:  Fever. Shortness of breath. Generalized weakness. Current history of sarcoidosis with chronic lung disease.  EXAM: PORTABLE CHEST - 1 VIEW  COMPARISON:  DG CHEST 2 VIEW dated 07/28/2013; DG CHEST 2 VIEW dated 03/12/2012; DG CHEST 2 VIEW dated 05/20/2011; DG CHEST 2 VIEW dated 02/13/2011; DG CHEST 2 VIEW dated 03/23/2010  FINDINGS: Chronic interstitial lung disease, not significantly changed since prior examinations. No new pulmonary parenchymal abnormalities. Cardiac  silhouette moderately enlarged but stable, allowing for differences in technique. Pulmonary vascularity normal. No visible pleural effusions.  IMPRESSION: Stable severe chronic interstitial lung disease, likely fibrosis. Stable cardiomegaly without pulmonary edema. No acute cardiopulmonary disease.   Electronically Signed   By: Hulan Saas M.D.   On: 01/17/2014 18:23    Scheduled Meds: . antiseptic oral rinse  15 mL Mouth Rinse q12n4p  . aspirin EC  81 mg Oral Daily  . atorvastatin  10 mg Oral q1800  . ceFEPime (MAXIPIME) IV  1 g Intravenous Q8H  . chlorhexidine  15 mL Mouth Rinse BID  . folic acid  0.5 mg Oral QPC breakfast  . heparin  5,000 Units Subcutaneous Q8H  . insulin aspart  0-15 Units Subcutaneous TID WC  . insulin aspart  0-5 Units Subcutaneous QHS  . losartan  25 mg Oral Daily  . methylPREDNISolone (SOLU-MEDROL) injection  80 mg Intravenous Q6H  . metoprolol tartrate  12.5 mg Oral BID  . pantoprazole  40 mg Oral Daily  . vancomycin  1,000 mg Intravenous Q12H   Continuous Infusions:   Active Problems:   Sarcoidosis   HYPERTENSION   GERD   DIABETES MELLITUS, TYPE II, BORDERLINE   HCAP (healthcare-associated pneumonia)   Respiratory failure    Time spent: 30min    Gdc Endoscopy Center LLCJOSEPH,Nelle Sayed  [facility] Pager [phone]. If 7PM-7AM, please contact night-coverage at [email or web address], password [email or web address] 01/18/2014, 7:53 AM  LOS: 1 day

## 2014-01-18 NOTE — Progress Notes (Signed)
*  PRELIMINARY RESULTS* Vascular Ultrasound Lower extremity venous duplex has been completed.  Preliminary findings: no evidence of DVT   Farrel DemarkJill Eunice, RDMS, RVT  01/18/2014, 9:05 AM

## 2014-01-18 NOTE — Consult Note (Signed)
Name: [name] MRN: [id number] DOB: 1936/07/06    ADMISSION DATE:  01/17/2014 CONSULTATION DATE:  01/18/14  REFERRING MD :  Dr. Jomarie LongsJoseph  PRIMARY SERVICE: TRH  CHIEF COMPLAINT:  Respiratory Failure  BRIEF PATIENT DESCRIPTION: 78 y/o F with PMH of sarcoid on chronic steroids admitted on 2/2 with SOB / Cough, fever & hypoxemic respiratory failure.   SIGNIFICANT EVENTS / STUDIES:  12/30-1/1 - admit with syncopal event, elevated trop, neg heart cath  .................................................................................................................................................................... 2/3 - admit with hypoxic resp failure.  CXR with stable chronic ILD, no acute process  LINES / TUBES:   CULTURES: BCx2 2/2>>> UC 2/2>>> Sputum 2/2>>> UA 2/2>>>rare bacteria, 0-2 wbc, neg nitrate  ANTIBIOTICS: vanco 2/2 >>  Cefepime 2/2 >>   HISTORY OF PRESENT ILLNESS:  78 y/o F with PMH of sarcoidosis on chronic steroids, methotrexate / 2L O2 (followed by Dr. Vassie LollAlva), HTN, DM, GERD, mild CAD,  osteoarthritis and recent admission from 12/30 - 1/1 after a syncopal episode with elevated troponin - patient was admitted for chest pain and underwent heart cath with minimal disease.  After returning home, she reports decrease in activity.    [facility] with two week history of worsening weakness, chronic dry cough with minimal sputum production without change.  Previously stated subjective fevers but denies currently.   ER work up demonstrated a CXR with chronic disease, no acute infiltrate, negative lactic acid, mild elevated in BNP, mild elevation of troponin, WBC of 3.5.  In ER she was noted to be hypoxic on arrival with saturations of  69% on baseline O2 of 2L. EMS had concerns of distance of O2 concentrator from patients site.  Patient placed on oxygen & BiPAP with improvement in respiratory status.  PCCM consulted for further evaluation.   PAST  MEDICAL HISTORY :  Past Medical History  Diagnosis Date  . Sarcoidosis     hypoxic resp failure, follows with pulm for same  . Hypertension   . Allergic rhinitis, cause unspecified   . Esophageal reflux   . Vertigo   . Unspecified vitamin D deficiency   . OSTEOPENIA   . DIABETES MELLITUS, TYPE II, BORDERLINE   . Osteoarthritis     s/p TKR x 2 right  . CAD (coronary artery disease)     a. 11/2013 elevated troponin in setting of presyncope->Cath: LM nl, LAD 3359m, LCX nl, RCA 10-20 diff, EF 55-65%.   Past Surgical History  Procedure Laterality Date  . Appendectomy    . Knee arthroscopy    . Total knee arthroplasty Right     x2   Prior to Admission medications   Medication Sig Start Date End Date Taking? Authorizing Provider  aspirin EC 81 MG tablet Take 1 tablet (81 mg total) by mouth daily. 12/16/13  Yes Ok Anishristopher R Berge, NP  atorvastatin (LIPITOR) 10 MG tablet Take 1 tablet (10 mg total) by mouth daily at 6 PM. 12/16/13  Yes Ok Anishristopher R Berge, NP  Cholecalciferol (VITAMIN D) 2000 UNITS CAPS Take 1 capsule by mouth daily.     Yes Historical Provider, MD  folic acid (FOLVITE) 1 MG tablet Take 0.5 mg by mouth daily after breakfast.   Yes Historical Provider, MD  hydrochlorothiazide (MICROZIDE) 12.5 MG capsule Take 12.5 mg by mouth daily as needed (for BP).   Yes Historical Provider, MD  losartan (COZAAR) 25 MG tablet Take 1 tablet (25 mg total) by mouth daily. 12/07/13  Yes Newt LukesValerie A Leschber, MD  Menthol, Topical Analgesic, (BLUE-EMU MAXIMUM STRENGTH) 2.5 % LIQD Apply 1 application topically daily as needed (for k nee pain).   Yes Historical Provider, MD  methotrexate (RHEUMATREX) 2.5 MG tablet Take 10 mg by mouth once a week. On Mondays   Yes Historical Provider, MD  metoprolol tartrate (LOPRESSOR) 25 MG tablet Take 0.5 tablets (12.5 mg total) by mouth 2 (two) times daily. 12/16/13  Yes Ok Anis, NP  Multiple Vitamins-Minerals (CENTRUM SILVER PO) Take 1 tablet by mouth  daily.     Yes Historical Provider, MD  nitroGLYCERIN (NITROSTAT) 0.4 MG SL tablet Place 1 tablet (0.4 mg total) under the tongue every 5 (five) minutes x 3 doses as needed for chest pain. 12/16/13  Yes Ok Anis, NP  predniSONE (DELTASONE) 5 MG tablet Take 1 tablet on Mon, Wed, friday 01/04/14  Yes Oretha Milch, MD  traMADol (ULTRAM) 50 MG tablet Take 1 tablet (50 mg total) by mouth every 8 (eight) hours as needed for moderate pain or severe pain. 12/07/13  Yes Newt Lukes, MD   No Known Allergies  FAMILY HISTORY:  Family History  Problem Relation Age of Onset  . Asthma Sister   . Bone cancer Mother   . Diabetes type II Sister    SOCIAL HISTORY:  reports that she quit smoking about 28 years ago. Her smoking use included Cigarettes. She has a 16 pack-year smoking history. She has never used smokeless tobacco. She reports that she does not drink alcohol. Her drug history is not on file.  REVIEW OF SYSTEMS:  Unable to complete as pt is on bipap.   SUBJECTIVE:   VITAL SIGNS: Temp:  [98.7 F (37.1 C)-102 F (38.9 C)] 98.7 F (37.1 C) (02/02 2315) Pulse Rate:  [77-112] 77 (02/03 0200) Resp:  [17-34] 29 (02/03 0200) BP: (105-177)/(75-101) 108/78 mmHg (02/03 0200) SpO2:  [81 %-100 %] 97 % (02/03 0200) FiO2 (%):  [50 %-100 %] 50 % (02/03 0155) Weight:  [201 lb 8 oz (91.4 kg)-201 lb 15.1 oz (91.6 kg)] 201 lb 15.1 oz (91.6 kg) (02/02 2315)  HEMODYNAMICS:    VENTILATOR SETTINGS: Vent Mode:  [-] BIPAP FiO2 (%):  [50 %-100 %] 50 % Set Rate:  [15 bmp] 15 bmp PEEP:  [5 cmH20] 5 cmH20  INTAKE / OUTPUT: Intake/Output     02/02 0701 - 02/03 0700   I.V. (mL/kg) 50 (0.5)   IV Piggyback 50   Total Intake(mL/kg) 100 (1.1)   Urine (mL/kg/hr) 1195   Total Output 1195   Net -1095         PHYSICAL EXAMINATION: General:  Chronically ill in NAD on bipap Neuro:  AAOx4, speech clear through bipap HEENT:  Mm pink/moist, no jvd Cardiovascular:  s1s2 rrr, sr on monitor Lungs:   resp's even/non-labored, lungs bilaterally diminished  Abdomen:  Obese/soft, bs x4 active Musculoskeletal:  No acute deformities  Skin:  Warm/dry, trace LE edema  LABS:  CBC  Recent Labs Lab 01/17/14 1825 01/18/14 0408  WBC 3.6* 3.5*  HGB 13.4 12.8  HCT 41.4 40.7  PLT 124* PENDING   Coag's No results found for this basename: APTT, INR,  in the last 168 hours BMET  Recent Labs Lab 01/17/14 1825 01/18/14 0408  NA 142 144  K 3.9 3.6*  CL 102 104  CO2 28 21  BUN 11 13  CREATININE 0.84 0.81  GLUCOSE 113* 241*   Electrolytes  Recent Labs Lab 01/17/14 1825 01/18/14 0408  CALCIUM 8.7 7.5*  Sepsis Markers  Recent Labs Lab 01/17/14 1840  LATICACIDVEN 1.80   ABG  Recent Labs Lab 01/18/14 0013 01/18/14 0245  PHART 7.165* 7.204*  PCO2ART 63.4* 54.5*  PO2ART 128.0* 120.0*   Liver Enzymes  Recent Labs Lab 01/17/14 1825  AST 21  ALT 16  ALKPHOS 45  BILITOT 3.2*  ALBUMIN 3.7   Cardiac Enzymes  Recent Labs Lab 01/17/14 2325  PROBNP 13392.0*   Glucose  Recent Labs Lab 01/17/14 2339  GLUCAP 160*    Imaging Dg Chest Portable 1 View  01/17/2014   CLINICAL DATA:  Fever. Shortness of breath. Generalized weakness. Current history of sarcoidosis with chronic lung disease.  EXAM: PORTABLE CHEST - 1 VIEW  COMPARISON:  DG CHEST 2 VIEW dated 07/28/2013; DG CHEST 2 VIEW dated 03/12/2012; DG CHEST 2 VIEW dated 05/20/2011; DG CHEST 2 VIEW dated 02/13/2011; DG CHEST 2 VIEW dated 03/23/2010  FINDINGS: Chronic interstitial lung disease, not significantly changed since prior examinations. No new pulmonary parenchymal abnormalities. Cardiac silhouette moderately enlarged but stable, allowing for differences in technique. Pulmonary vascularity normal. No visible pleural effusions.  IMPRESSION: Stable severe chronic interstitial lung disease, likely fibrosis. Stable cardiomegaly without pulmonary edema. No acute cardiopulmonary disease.   Electronically Signed   By: Hulan Saas M.D.   On: 01/17/2014 18:23    ASSESSMENT / PLAN:  PULMONARY A: Acute Hypoxic Respiratory Failure  Pulmonary Sarcoidosis UA obstruction > playing a role here, improved 2/3 P:   -bipap PRN for increased WOB, will try off 2/3 am  -oxygen to support saturations >92% -PRN duoneb -will need O2 set up checked at home after discharge (EMS reported concentrator to far away from patient) -pulmonary hygiene -trend cxr -continue baseline prednisone (5mg  M/W/F), s/p 125 medrol in ER -continue methrotrexate  CARDIOVASCULAR A:  Hx HTN Mild CAD - by [facility] in 11/2013 P:  -per TRH -lasix x1 per TRH 2/3, follow I./O. Agree with diuresis as she can tolerate -consider TTE. Was ordered 12/31 as out[pt but not yet done.   RENAL A:   Mild Hypokalemia P:   -replace as indicated  GASTROINTESTINAL A:   GERD P:   -PPI  HEMATOLOGIC A:   DVT Proph  Consider DVT/PE > LE prelim negative P:  -heparin SQ -assess LE doppler for DVT (hx of syncope, admit with elevated trop & neg [facility]) > prelim result negative for LE DVT  INFECTIOUS A:   Fever At Risk Opportunistic Infection / Immunocompromised State - on methotrexate / prednisone P:   -follow cultures -empiric abx > vanco + cefepime  ENDOCRINE A:   Diabetes Mellitus   P:   -SSI per TRH  NEUROLOGIC A:   No acute process. Hx of syncope / vertigo P:   -monitor, supportive care  Canary Brim, NP-C [facility] Pgr: [phone] or [phone]   I have personally obtained a history, examined the patient, evaluated laboratory and imaging results, formulated the assessment and plan and placed orders.  CRITICAL CARE: The patient is critically ill with multiple organ systems failure and requires high complexity decision making for assessment and support, frequent evaluation and titration of therapies, application of advanced monitoring technologies and extensive interpretation of multiple databases. Critical Care  Time devoted to patient care services described in this note is 45 minutes.  01/18/2014, 4:49 AM   Levy Pupa, MD, PhD 01/18/2014, 9:51 AM [facility] [phone] or if no answer [phone]

## 2014-01-18 NOTE — Progress Notes (Signed)
RT place patient on BIPAP due to work of breathing and panic ABG results. RT placed patient on pressure control on 10/5. Patient seems to be tolerating well. RT will get an ABG one hour after BIPAP is started.

## 2014-01-18 NOTE — Progress Notes (Signed)
Inpatient Diabetes Program Recommendations  AACE/ADA: New Consensus Statement on Inpatient Glycemic Control (2013)  Target Ranges:  Prepandial:   less than 140 mg/dL      Peak postprandial:   less than 180 mg/dL (1-2 hours)      Critically ill patients:  140 - 180 mg/dL   Reason for Visit: Hyperglycemia  Diabetes history: Type 2 "borderline" Outpatient Diabetes medications: None Current orders for Inpatient glycemic control: Novolog moderate tidwc Results for [name], Dashanti A (MRN [id number]) as of 01/18/2014 16:43  Ref. Range 01/17/2014 23:39 01/18/2014 08:05 01/18/2014 11:43  Glucose-Capillary Latest Range: 70-99 mg/dL 409160 (H) 811248 (H) 914199 (H)  Results for [name], Kym A (MRN [id number]) as of 01/18/2014 16:43  Ref. Range 01/17/2014 23:25  Hemoglobin A1C Latest Range: <5.7 % 5.9 (H)  Results for [name], Milca A (MRN [id number]) as of 01/18/2014 16:43  Ref. Range 01/18/2014 04:08  Sodium Latest Range: 137-147 mEq/L 144  Potassium Latest Range: 3.7-5.3 mEq/L 3.6 (L)  Chloride Latest Range: 96-112 mEq/L 104  CO2 Latest Range: 19-32 mEq/L 21  BUN Latest Range: 6-23 mg/dL 13  Creatinine Latest Range: 0.50-1.10 mg/dL 6.290.81  Calcium Latest Range: 8.4-10.5 mg/dL 7.5 (L)  GFR calc non Af Amer Latest Range: >90 mL/min 69 (L)  GFR calc Af Amer Latest Range: >90 mL/min 80 (L)  Glucose Latest Range: 70-99 mg/dL 528241 (H)  Steroid-induced hyperglycemia in pt with prediabetes.   Inpatient Diabetes Program Recommendations Correction (SSI): Increase Novolog to resistant tidwc and hs while on steroids. HgbA1C: 5.9% - prediabetes Diet: Add CHO mod medium to heart healthy diet   Note: Will order Living Well With Diabetes book. F/U in am regarding OP Diabetes Education.  Thank you. [name], [name], [name], [name] Inpatient Diabetes Coordinator [phone]

## 2014-01-19 ENCOUNTER — Ambulatory Visit: Payer: Medicare Other | Admitting: Adult Health

## 2014-01-19 ENCOUNTER — Inpatient Hospital Stay (HOSPITAL_COMMUNITY): Payer: Medicare Other

## 2014-01-19 DIAGNOSIS — I200 Unstable angina: Secondary | ICD-10-CM

## 2014-01-19 DIAGNOSIS — R269 Unspecified abnormalities of gait and mobility: Secondary | ICD-10-CM

## 2014-01-19 DIAGNOSIS — I369 Nonrheumatic tricuspid valve disorder, unspecified: Secondary | ICD-10-CM

## 2014-01-19 LAB — GLUCOSE, CAPILLARY
GLUCOSE-CAPILLARY: 221 mg/dL — AB (ref 70–99)
GLUCOSE-CAPILLARY: 183 mg/dL — AB (ref 70–99)
GLUCOSE-CAPILLARY: 138 mg/dL — AB (ref 70–99)
GLUCOSE-CAPILLARY: 120 mg/dL — AB (ref 70–99)

## 2014-01-19 LAB — CBC
HCT: 35.9 % — ABNORMAL LOW (ref 36.0–46.0)
HEMOGLOBIN: 11.2 g/dL — AB (ref 12.0–15.0)
MCH: 30.7 pg (ref 26.0–34.0)
MCHC: 31.2 g/dL (ref 30.0–36.0)
MCV: 98.4 fL (ref 78.0–100.0)
Platelets: 112 10*3/uL — ABNORMAL LOW (ref 150–400)
RBC: 3.65 MIL/uL — ABNORMAL LOW (ref 3.87–5.11)
RDW: 15.9 % — ABNORMAL HIGH (ref 11.5–15.5)
WBC: 8.6 10*3/uL (ref 4.0–10.5)

## 2014-01-19 LAB — BASIC METABOLIC PANEL
BUN: 17 mg/dL (ref 6–23)
CALCIUM: 7.8 mg/dL — AB (ref 8.4–10.5)
CO2: 30 meq/L (ref 19–32)
Chloride: 103 mEq/L (ref 96–112)
Creatinine, Ser: 0.84 mg/dL (ref 0.50–1.10)
GFR calc Af Amer: 77 mL/min — ABNORMAL LOW (ref >90)
GFR calc non Af Amer: 66 mL/min — ABNORMAL LOW (ref >90)
Glucose, Bld: 172 mg/dL — ABNORMAL HIGH (ref 70–99)
POTASSIUM: 4.7 meq/L (ref 3.7–5.3)
SODIUM: 140 meq/L (ref 137–147)

## 2014-01-19 LAB — PHOSPHORUS: PHOSPHORUS: 2.2 mg/dL — AB (ref 2.3–4.6)

## 2014-01-19 LAB — MAGNESIUM: Magnesium: 1.8 mg/dL (ref 1.5–2.5)

## 2014-01-19 MED ORDER — ZOLPIDEM TARTRATE 5 MG PO TABS
5.0000 mg | ORAL_TABLET | Freq: Once | ORAL | Status: AC
  Administered 2014-01-19: 5 mg via ORAL
  Filled 2014-01-19: qty 1

## 2014-01-19 MED ORDER — METHYLPREDNISOLONE SODIUM SUCC 40 MG IJ SOLR
40.0000 mg | Freq: Two times a day (BID) | INTRAMUSCULAR | Status: DC
  Filled 2014-01-19 (×2): qty 1

## 2014-01-19 MED ORDER — PREDNISONE 20 MG PO TABS
40.0000 mg | ORAL_TABLET | Freq: Every day | ORAL | Status: DC
  Administered 2014-01-19 – 2014-01-21 (×3): 40 mg via ORAL
  Filled 2014-01-19 (×5): qty 2

## 2014-01-19 NOTE — Evaluation (Signed)
Physical Therapy Evaluation Patient Details Name: [name] MRN: [id number] DOB: 1936/07/23 Today's Date: 01/19/2014 Time: 4098-1191 PT Time Calculation (min): 14 min  PT Assessment / Plan / Recommendation History of Present Illness  78 year old female with sarcoidosis on chronic steroid therapy, recently discharged from [facility] 12/16/2013 after being admitted for and STEMI, status post cardiac catheter on number 31st 2014 indicative of mild nonobstructive coronary artery disease with normal LV function. She has presented to emergency department on 01/17/2014 with main concern of two-week duration of progressively worsening shortness of breath, mostly exertional, associated with productive cough of yellow sputum, malaise, poor oral intake. In the emergency department, patient found to be in acute hypoxic respiratory failure with oxygen saturation in the 70s. Responded well to administration of oxygen via NRB. Patient also febrile on admission with temperature 102 F.  Clinical Impression  Pt admitted with acute respiratory failure. Pt currently with functional limitations due to the deficits listed below (see PT Problem List).  Pt will benefit from skilled PT to increase their independence and safety with mobility to allow discharge to the venue listed below.  Pt reports not being at her baseline.  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    PT Assessment  Patient needs continued PT services    Follow Up Recommendations  Home health PT    Does the patient have the potential to tolerate intense rehabilitation      Barriers to Discharge        Equipment Recommendations  None recommended by PT    Recommendations for Other Services     Frequency Min 3X/week    Precautions / Restrictions Precautions Precaution Comments: chronic O2    Pertinent Vitals/Pain Maintained 3L O2 [place]      Mobility  Bed Mobility General bed mobility comments: pt up in recliner upon arrival Transfers Overall transfer level: Needs assistance Equipment used: Rolling walker (2 wheeled) Transfers: Sit to/from Stand Sit to Stand: Min guard General transfer comment: increased effort to rise, verbal cues for hand placement Ambulation/Gait Ambulation/Gait assistance: Min guard Ambulation Distance (Feet): 40 Feet Assistive device: Rolling walker (2 wheeled) Gait Pattern/deviations: Step-through pattern Gait velocity: decr General Gait Details: ambulated on 3L O2 and pt reports 7/10 dsypnea limiting distance (pt reports usually on 2L at home)    Exercises     PT Diagnosis: Difficulty walking  PT Problem List: Decreased activity tolerance;Decreased mobility;Cardiopulmonary status limiting activity PT Treatment Interventions: DME instruction;Gait training;Stair training;Functional mobility training;Therapeutic activities;Therapeutic exercise;Patient/family education     PT Goals(Current goals can be found in the care plan section) Acute Rehab PT Goals PT Goal Formulation: With patient Time For Goal Achievement: 01/26/14 Potential to Achieve Goals: Good  Visit Information  Last PT Received On: 01/19/14 Assistance Needed: +1 History of Present Illness: 78 year old female with sarcoidosis on chronic steroid therapy, recently discharged from [facility] 12/16/2013 after being admitted for and STEMI, status post cardiac catheter on number 31st 2014 indicative of mild nonobstructive coronary artery disease with normal LV function. She has presented to emergency department on 01/17/2014 with main concern of two-week duration of progressively worsening shortness of breath, mostly exertional, associated with productive cough of yellow sputum, malaise, poor oral intake. In the emergency department, patient found to be in acute hypoxic respiratory  failure with oxygen saturation in the 70s. Responded well to administration  of oxygen via NRB. Patient also febrile on admission with temperature 102 F.       Prior Functioning  Home Living Family/patient expects to be discharged to:: Private residence Living Arrangements: Other relatives;Other (Comment) (grandson) Type of Home: House Home Access: Stairs to enter Entergy CorporationEntrance Stairs-Number of Steps: 2-3, states she has ramp but it's too steep Home Layout: One level Home Equipment: Environmental consultantWalker - 2 wheels Prior Function Level of Independence: Independent with assistive device(s) Comments: reports grandson assists with housekeeping and friends provide meals Communication Communication: No difficulties    Cognition  Cognition Arousal/Alertness: Awake/alert Behavior During Therapy: WFL for tasks assessed/performed Overall Cognitive Status: Within Functional Limits for tasks assessed    Extremity/Trunk Assessment Lower Extremity Assessment Lower Extremity Assessment: Overall WFL for tasks assessed   Balance    End of Session PT - End of Session Equipment Utilized During Treatment: Oxygen Activity Tolerance: Patient limited by fatigue Patient left: in chair;with call bell/phone within reach  GP     [facility] SystemEMYRE,KATHrine E 01/19/2014, 3:44 PM Zenovia JarredKati Eisa Conaway, PT, DPT 01/19/2014 Pager: [phone]

## 2014-01-19 NOTE — Progress Notes (Addendum)
Patient ID: [name] Wuertz, female   DOB: 03-15-1936, 78 y.o.   MRN: [id number]  [facility] PROGRESS NOTE  [name] Blacketer NFA:[id number] DOB: 03-15-1936 DOA: 01/17/2014 PCP: Rene PaciValerie Leschber, MD  Brief narrative: Patient is 78 year old female with sarcoidosis on chronic steroid therapy, recently discharged from [facility] [place] 12/16/2013 after being admitted for and STEMI, status post cardiac catheter on number 31st 2014 indicative of mild nonobstructive coronary artery disease with normal LV function. She has presented to emergency department on 01/17/2014 with main concern of two-week duration of progressively worsening shortness of breath, mostly exertional, associated with productive cough of yellow sputum, malaise, poor oral intake. In the emergency department, patient found to be in acute hypoxic respiratory failure with oxygen saturation in the 70s. Responded well to administration of oxygen via NRB. Patient also febrile on admission with temperature 102 F.  Active Problems:   Acute hypoxic respiratory failure - Most likely secondary to healthcare associated pneumonia versus pulmonary sarcoidosis flare - Continuing vancomycin and Maxipime day 3 - Continuing Solu-Medrol with planned tapering, currently 80 mg every 6 hours, change to 40 mg every 12 today, possible transition to prednisone in Hayes.m. - Continue duo nebs scheduled every 4 hours as needed - appreciate PCCM input    Sarcoidosis - Continue tapering Solu-Medrol with possible transition to prednisone in the morning   HYPERTENSION - On soft diet this morning with systolic blood pressure in 90s - stop losartan for now - Continue metoprolol but may need to hold if systolic blood pressure drops below 90   GERD - Continue protonix   DIABETES MELLITUS, TYPE II, BORDERLINE - Reasonable inpatient control, continue sliding scale insulin   Coronary artery disease - Will order 2-D echo as this was planned to be done in an  outpatient setting January 2015 - CXR this am with possible pulmonary edema - strict I's and O's, daily weights, weight this AM 190 lbs - weight trend since admission 2/2 --> 205 lbs --> 195 lbs --> 190 lbs this AM     HCAP (healthcare-associated pneumonia) - Management with broad-spectrum antibiotics as noted above, vancomycin and Maxipime day 3, followup on sputum culture  Code Status: Patient is Hayes full code  Family Communication: none at bedside  Disposition Plan: Transfer to telemetry unit   Consultants:  PCCM Antibiotics:  Vanc  Cefepime Procedures/Studies: CXR 01/19/2014 Interstitial lung disease, likely with fibrosis. Opacities are above baseline (compared to 07/28/2013), which may reflect mild superimposed edema or atypical infection.   CXR 01/18/2014 Diffuse interstitial lung densities suggest chronic changes. Slightly increased densities in both lungs may represent areas of atelectasis.  Stable enlargement of the cardiac silhouette.   CXR 01/17/2014 Stable severe chronic interstitial lung disease, likely fibrosis. Stable cardiomegaly without pulmonary edema. No acute cardiopulmonary disease.    HPI/Subjective: No events overnight.   Objective: Filed Vitals:   01/19/14 0800 01/19/14 0900 01/19/14 1000 01/19/14 1100  BP:    97/64  Pulse: 80 66 80 59  Temp: 98.5 F (36.9 C)   98 F (36.7 C)  TempSrc: Oral   Oral  Resp: 24 14 26 21   Height:      Weight:      SpO2: 100% 100% 96% 98%    Intake/Output Summary (Last 24 hours) at 01/19/14 1130 Last data filed at 01/19/14 0700  Gross per 24 hour  Intake    895 ml  Output   1250 ml  Net   -355 ml    Exam:  General:  Pt is alert, follows commands appropriately, not in acute distress  Cardiovascular: Regular rate and rhythm, S1/S2, no murmurs, no rubs, no gallops  Respiratory: diminished breath sounds bilaterally with mild end expiratory wheezing   Abdomen: Soft, non tender, non distended, bowel sounds present, no  guarding  Extremities: No edema, pulses DP and PT palpable bilaterally  Neuro: Grossly nonfocal  Data Reviewed: Basic Metabolic Panel:  Recent Labs Lab 01/17/14 1825 01/18/14 0408 01/19/14 0325  NA 142 144 140  K 3.9 3.6* 4.7  CL 102 104 103  CO2 28 21 30   GLUCOSE 113* 241* 172*  BUN 11 13 17   CREATININE 0.84 0.81 0.84  CALCIUM 8.7 7.5* 7.8*  MG  --   --  1.8  PHOS  --   --  2.2*   Liver Function Tests:  Recent Labs Lab 01/17/14 1825  AST 21  ALT 16  ALKPHOS 45  BILITOT 3.2*  PROT 7.1  ALBUMIN 3.7   CBC:  Recent Labs Lab 01/17/14 1825 01/18/14 0408 01/19/14 0325  WBC 3.6* 3.5* 8.6  NEUTROABS 3.0  --   --   HGB 13.4 12.8 11.2*  HCT 41.4 40.7 35.9*  MCV 96.5 98.8 98.4  PLT 124* 108* 112*   CBG:  Recent Labs Lab 01/18/14 0805 01/18/14 1143 01/18/14 1625 01/18/14 2131 01/19/14 0816  GLUCAP 248* 199* 190* 193* 221*    Recent Results (from the past 240 hour(s))  CULTURE, BLOOD (ROUTINE X 2)     Status: None   Collection Time    01/17/14  6:25 PM      Result Value Range Status   Specimen Description BLOOD LEFT FOREARM   Final   Special Requests BOTTLES DRAWN AEROBIC AND ANAEROBIC   Final   Culture  Setup Time     Final   Value: 01/17/2014 22:18     Performed at [facility]   Culture     Final   Value:        BLOOD CULTURE RECEIVED NO GROWTH TO DATE CULTURE WILL BE HELD FOR 5 DAYS BEFORE ISSUING Hayes FINAL NEGATIVE REPORT     Performed at [facility]   Report Status PENDING   Incomplete  CULTURE, BLOOD (ROUTINE X 2)     Status: None   Collection Time    01/17/14  6:46 PM      Result Value Range Status   Specimen Description BLOOD RIGHT ARM   Final   Special Requests BOTTLES DRAWN AEROBIC AND ANAEROBIC   Final   Culture  Setup Time     Final   Value: 01/17/2014 22:17     Performed at [facility]   Culture     Final   Value:        BLOOD CULTURE RECEIVED NO GROWTH TO DATE CULTURE WILL BE HELD FOR 5 DAYS  BEFORE ISSUING Hayes FINAL NEGATIVE REPORT     Performed at [facility]   Report Status PENDING   Incomplete  URINE CULTURE     Status: None   Collection Time    01/17/14  7:33 PM      Result Value Range Status   Specimen Description URINE, CLEAN CATCH   Final   Special Requests NONE   Final   Culture  Setup Time     Final   Value: 01/17/2014 22:09     Performed at [facility] Count     Final  Value: NO GROWTH     Performed at [facility]     Final   Value: NO GROWTH     Performed at [facility]   Report Status 01/18/2014 FINAL   Final  MRSA PCR SCREENING     Status: None   Collection Time    01/17/14 11:08 PM      Result Value Range Status   MRSA by PCR NEGATIVE  NEGATIVE Final   Comment:            The GeneXpert MRSA Assay (FDA     approved for NASAL specimens     only), is one component of [place]     comprehensive MRSA colonization     surveillance program. It is not     intended to diagnose MRSA     infection nor to guide or     monitor treatment for     MRSA infections.  CULTURE, EXPECTORATED SPUTUM-ASSESSMENT     Status: None   Collection Time    01/18/14  4:08 PM      Result Value Range Status   Specimen Description SPUTUM   Final   Special Requests Immunocompromised   Final   Sputum evaluation     Final   Value: THIS SPECIMEN IS ACCEPTABLE. RESPIRATORY CULTURE REPORT TO FOLLOW.   Report Status 01/18/2014 FINAL   Final  CULTURE, RESPIRATORY (NON-EXPECTORATED)     Status: None   Collection Time    01/18/14  4:08 PM      Result Value Range Status   Specimen Description SPU   Final   Special Requests NONE   Final   Gram Stain     Final   Value: NO WBC SEEN     NO SQUAMOUS EPITHELIAL CELLS SEEN     NO ORGANISMS SEEN     Performed at [facility]   Culture PENDING   Incomplete   Report Status PENDING   Incomplete     Scheduled Meds: . aspirin EC  81 mg Oral Daily  . atorvastatin  10 mg Oral q1800  .  ceFEPime  IV  1 g Intravenous Q8H  . folic acid  0.5 mg Oral QPC breakfast  . heparin  5,000 Units Subcutaneous Q8H  . insulin aspart  0-15 Units Subcutaneous TID WC  . insulin aspart  0-5 Units Subcutaneous QHS  . losartan  25 mg Oral Daily  . methylPRED injection  80 mg Intravenous Q6H  . metoprolol tartrate  12.5 mg Oral BID  . pantoprazole  40 mg Oral Daily  . vancomycin  1,000 mg Intravenous Q12H   Continuous Infusions:   Debbora Presto, MD  [facility] Pager [phone]  If 7PM-7AM, please contact night-coverage [email or web address] Password [email or web address] 01/19/2014, 11:30 AM   LOS: 2 days

## 2014-01-19 NOTE — Progress Notes (Signed)
Echocardiogram 2D Echocardiogram has been performed.  [name] 01/19/2014, 2:46 PM

## 2014-01-19 NOTE — Progress Notes (Addendum)
Name: LEVAEH VICE MRN: [id number] DOB: [birth year]    ADMISSION DATE:  01/17/2014 CONSULTATION DATE:  01/18/14  REFERRING MD :  Dr. Jomarie Longs  PRIMARY SERVICE: [facility]  CHIEF COMPLAINT:  Respiratory Failure  BRIEF PATIENT DESCRIPTION: 78 y/o F with PMH of sarcoid on chronic steroids admitted on 2/2 with SOB / Cough, fever & hypoxemic respiratory failure.   SIGNIFICANT EVENTS / STUDIES:  12/30-1/1 - admit with syncopal event, elevated trop, neg heart cath  .................................................................................................................................................................... 2/3 - admit with hypoxic resp failure.  CXR with stable chronic ILD, no acute process 2/4 looks great LINES / TUBES:   CULTURES: BCx2 2/2>>> UC 2/2>>>neg Sputum 2/2>>> UA 2/2>>>rare bacteria, 0-2 wbc, neg nitrate  ANTIBIOTICS: vanco 2/2 >>  Cefepime 2/2 >>   HISTORY OF PRESENT ILLNESS:  78 y/o F with PMH of sarcoidosis on chronic steroids, methotrexate / 2L O2 (followed by Dr. Vassie Loll), HTN, DM, GERD, mild CAD,  osteoarthritis and recent admission from 12/30 - 1/1 after a syncopal episode with nstemi - patient was admitted for chest pain and underwent heart cath with minimal disease.  After returning home, she reports decrease in activity.    She presented to [facility] ER on 2/2 with two week history of worsening weakness, chronic dry cough with minimal sputum production without change.  Previously stated subjective fevers but denies currently.   ER work up demonstrated a CXR with chronic disease, no acute infiltrate, negative lactic acid, mild elevated in BNP, mild elevation of troponin, WBC of 3.5.  In ER she was noted to be hypoxic on arrival with saturations of  69% on baseline O2 of 2L. EMS had concerns of distance of O2 concentrator from patients site.  Patient placed on oxygen & BiPAP with improvement in respiratory status.  PCCM consulted for further evaluation.    SUBJECTIVE:  Much improved today oob to chair Afebrile Decreased cough VITAL SIGNS: Temp:  [97.7 F (36.5 C)-98.5 F (36.9 C)] 98.5 F (36.9 C) (02/04 0800) Pulse Rate:  [50-87] 80 (02/04 1000) Resp:  [14-30] 26 (02/04 1000) BP: (87-148)/(44-91) 134/80 mmHg (02/04 0727) SpO2:  [92 %-100 %] 96 % (02/04 1000) Weight:  [190 lb 7.6 oz (86.4 kg)] 190 lb 7.6 oz (86.4 kg) (02/04 0536)  HEMODYNAMICS:    VENTILATOR SETTINGS:    INTAKE / OUTPUT: Intake/Output     02/03 0701 - 02/04 0700 02/04 0701 - 02/05 0700   P.O. 480    I.V. (mL/kg) 225 (2.6)    IV Piggyback 550    Total Intake(mL/kg) 1255 (14.5)    Urine (mL/kg/hr) 1850 (0.9)    Total Output 1850     Net -595          Urine Occurrence  1 x     PHYSICAL EXAMINATION: General:  Chronically ill in NAD,looks much better today Neuro:  AAOx4, speech clear  HEENT:  Mm pink/moist, no jvd Cardiovascular:  s1s2 rrr, sr on monitor Lungs:  resp's even/non-labored, lungs bilaterally diminished  Abdomen:  Obese/soft, bs x4 active Musculoskeletal:  No acute deformities  Skin:  Warm/dry, trace LE edema  LABS:  CBC  Recent Labs Lab 01/17/14 1825 01/18/14 0408 01/19/14 0325  WBC 3.6* 3.5* 8.6  HGB 13.4 12.8 11.2*  HCT 41.4 40.7 35.9*  PLT 124* 108* 112*   Coag's No results found for this basename: APTT, INR,  in the last 168 hours BMET  Recent Labs Lab 01/17/14 1825 01/18/14 0408 01/19/14 0325  NA 142 144 140  K  3.9 3.6* 4.7  CL 102 104 103  CO2 28 21 30   BUN 11 13 17   CREATININE 0.84 0.81 0.84  GLUCOSE 113* 241* 172*   Electrolytes  Recent Labs Lab 01/17/14 1825 01/18/14 0408 01/19/14 0325  CALCIUM 8.7 7.5* 7.8*  MG  --   --  1.8  PHOS  --   --  2.2*   Sepsis Markers  Recent Labs Lab 01/17/14 1840  LATICACIDVEN 1.80   ABG  Recent Labs Lab 01/18/14 0013 01/18/14 0245 01/18/14 1521  PHART 7.165* 7.204* 7.330*  PCO2ART 63.4* 54.5* 54.2*  PO2ART 128.0* 120.0* 70.4*   Liver  Enzymes  Recent Labs Lab 01/17/14 1825  AST 21  ALT 16  ALKPHOS 45  BILITOT 3.2*  ALBUMIN 3.7   Cardiac Enzymes  Recent Labs Lab 01/17/14 2325  PROBNP 13392.0*   Glucose  Recent Labs Lab 01/17/14 2339 01/18/14 0805 01/18/14 1143 01/18/14 1625 01/18/14 2131 01/19/14 0816  GLUCAP 160* 248* 199* 190* 193* 221*    Imaging Dg Chest Port 1 View  01/19/2014   CLINICAL DATA:  Infiltrates.  EXAM: PORTABLE CHEST - 1 VIEW  COMPARISON:  01/18/2014  FINDINGS: Marked cardiopericardial enlargement, size and morphology unchanged. Diffuse interstitial opacities, stable from prior but mildly increased from baseline imaging 07/28/2013. No effusion or pneumothorax.  IMPRESSION: Interstitial lung disease, likely with fibrosis. Opacities are above baseline (compared to 07/28/2013), which may reflect mild superimposed edema or atypical infection.   Electronically Signed   By: Tiburcio Pea M.D.   On: 01/19/2014 06:56   Dg Chest Port 1 View  01/18/2014   CLINICAL DATA:  Shortness of breath.  Evaluate for pneumonia.  EXAM: PORTABLE CHEST - 1 VIEW  COMPARISON:  01/17/2014  FINDINGS: Stable enlargement of the cardiac silhouette. The trachea is midline. Diffuse interstitial lung densities suggest chronic changes. Slightly increased densities in left mid and right lower lung regions. The findings could represent some atelectasis. Degenerative changes in the shoulders.  IMPRESSION: Diffuse interstitial lung densities suggest chronic changes. Slightly increased densities in both lungs may represent areas of atelectasis.  Stable enlargement of the cardiac silhouette.   Electronically Signed   By: Richarda Overlie M.D.   On: 01/18/2014 09:00   Dg Chest Portable 1 View  01/17/2014   CLINICAL DATA:  Fever. Shortness of breath. Generalized weakness. Current history of sarcoidosis with chronic lung disease.  EXAM: PORTABLE CHEST - 1 VIEW  COMPARISON:  DG CHEST 2 VIEW dated 07/28/2013; DG CHEST 2 VIEW dated 03/12/2012; DG  CHEST 2 VIEW dated 05/20/2011; DG CHEST 2 VIEW dated 02/13/2011; DG CHEST 2 VIEW dated 03/23/2010  FINDINGS: Chronic interstitial lung disease, not significantly changed since prior examinations. No new pulmonary parenchymal abnormalities. Cardiac silhouette moderately enlarged but stable, allowing for differences in technique. Pulmonary vascularity normal. No visible pleural effusions.  IMPRESSION: Stable severe chronic interstitial lung disease, likely fibrosis. Stable cardiomegaly without pulmonary edema. No acute cardiopulmonary disease.   Electronically Signed   By: Hulan Saas M.D.   On: 01/17/2014 18:23    ASSESSMENT / PLAN:  PULMONARY A: Acute Hypoxic/hypercarbic Respiratory Failure  Pulmonary Sarcoidosis UA obstruction > playing a role here, improved 2/3 P:   -bipap PRN for increased WOB, will leave off -oxygen to support saturations >92% -PRN duoneb -will need O2 set up checked at home after discharge (EMS reported concentrator to far away from patient) -pulmonary hygiene -change to 40 mg pred & taper to  baseline prednisone in 2-3 weeks (5mg  M/W/F), s/p  125 medrol in ER -continue methrotrexate  CARDIOVASCULAR A:  Hx HTN Mild CAD - by [facility] in 11/2013 P:  -per [facility] -lasix x1 per [facility] 2/3, follow I./O. Agree with diuresis as she can tolerate -consider TTE to assess for cor pulmonale. Was ordered 12/31 as out[pt but not yet done.   RENAL A:   Mild Hypokalemia P:   -replace as indicated  GASTROINTESTINAL A:   GERD P:   -PPI  HEMATOLOGIC A:   DVT Proph   LE doppler - negative for LE DVT P:  -heparin SQ   INFECTIOUS A:   Fever At Risk Opportunistic Infection / Immunocompromised State - on methotrexate / prednisone P:   -follow cultures -empiric abx > dc vanco in 24h, ct cefepime-transition to augmentin on discharge x 7ds total  ENDOCRINE A:   Diabetes Mellitus   P:   -SSI per [facility]  NEUROLOGIC A:   No acute process. Hx of syncope / vertigo P:    -monitor, supportive care  Brett CanalesSteve Minor ACNP [facility] Pager [phone] till 3 pm If no answer page [phone]  Independently examined pt, evaluated data & formulated above care plan with NP who scribed this note & edited by me.  Laini Urick V.  01/19/2014, 10:32 AM

## 2014-01-20 LAB — CBC
HCT: 37.3 % (ref 36.0–46.0)
HEMOGLOBIN: 11.6 g/dL — AB (ref 12.0–15.0)
MCH: 30.8 pg (ref 26.0–34.0)
MCHC: 31.1 g/dL (ref 30.0–36.0)
MCV: 98.9 fL (ref 78.0–100.0)
PLATELETS: 122 10*3/uL — AB (ref 150–400)
RBC: 3.77 MIL/uL — AB (ref 3.87–5.11)
RDW: 15.9 % — ABNORMAL HIGH (ref 11.5–15.5)
WBC: 9.7 10*3/uL (ref 4.0–10.5)

## 2014-01-20 LAB — BASIC METABOLIC PANEL
BUN: 25 mg/dL — ABNORMAL HIGH (ref 6–23)
CALCIUM: 8.3 mg/dL — AB (ref 8.4–10.5)
CO2: 30 mEq/L (ref 19–32)
Chloride: 104 mEq/L (ref 96–112)
Creatinine, Ser: 0.92 mg/dL (ref 0.50–1.10)
GFR calc non Af Amer: 59 mL/min — ABNORMAL LOW (ref >90)
GFR, EST AFRICAN AMERICAN: 69 mL/min — AB (ref >90)
GLUCOSE: 143 mg/dL — AB (ref 70–99)
POTASSIUM: 4.5 meq/L (ref 3.7–5.3)
Sodium: 141 mEq/L (ref 137–147)

## 2014-01-20 LAB — GLUCOSE, CAPILLARY
GLUCOSE-CAPILLARY: 108 mg/dL — AB (ref 70–99)
GLUCOSE-CAPILLARY: 203 mg/dL — AB (ref 70–99)
Glucose-Capillary: 159 mg/dL — ABNORMAL HIGH (ref 70–99)

## 2014-01-20 MED ORDER — PREDNISONE 10 MG PO TABS: ORAL_TABLET | ORAL | Status: DC

## 2014-01-20 NOTE — Progress Notes (Signed)
[facility]   Name: Desiree LikensMargaret A Hayes MRN: [id number] DOB: 06-Mar-1936    ADMISSION DATE:  01/17/2014 CONSULTATION DATE:  01/18/14  REFERRING MD :  Dr. Jomarie LongsJoseph  PRIMARY SERVICE: [facility]  CHIEF COMPLAINT:  Respiratory Failure  BRIEF PATIENT DESCRIPTION: 78 y/o F with PMH of sarcoid on chronic steroids admitted on 2/2 with SOB / Cough, fever & hypoxemic respiratory failure.   SIGNIFICANT EVENTS / STUDIES:  12/30-1/1 - admit with syncopal event, elevated trop, neg heart cath  .................................................................................................................................................................... 2/3 - admit with hypoxic resp failure.  CXR with stable chronic ILD, no acute process 2/4 looks great 2/4 2D echo>>> EF55-60%, grade 1 diastolic dysfunction, severely dilated RV, severe pulm HTN (PA press 82mmHg), severe TR  LINES / TUBES: none  CULTURES: BCx2 2/2>>> UC 2/2>>>neg Sputum 2/2>>>normal flora  UA 2/2>>>rare bacteria, 0-2 wbc, neg nitrate  ANTIBIOTICS: vanco 2/2 >> 2/4 Cefepime 2/2 >>    SUBJECTIVE:  Feeling much better.  Wants to go home.  Some SOB with activity but near baseline.  Looks good OOB in chair.   VITAL SIGNS: Temp:  [97.5 F (36.4 C)-98.6 F (37 C)] 97.8 F (36.6 C) (02/05 0850) Pulse Rate:  [60-78] 62 (02/05 0850) Resp:  [18-20] 18 (02/05 0850) BP: (97-122)/(64-76) 119/74 mmHg (02/05 0850) SpO2:  [93 %-100 %] 100 % (02/05 0850) Weight:  [191 lb 2.2 oz (86.7 kg)] 191 lb 2.2 oz (86.7 kg) (02/05 0647)  INTAKE / OUTPUT: Intake/Output     02/04 0701 - 02/05 0700 02/05 0701 - 02/06 0700   P.O. 360 240   I.V. (mL/kg)     IV Piggyback 100    Total Intake(mL/kg) 460 (5.3) 240 (2.8)   Urine (mL/kg/hr) 400 (0.2)    Total Output 400     Net +60 +240        Urine Occurrence 1 x    Stool Occurrence 1 x      PHYSICAL EXAMINATION: General:  Chronically ill in NAD sitting OOB in chair  Neuro:  AAOx4, speech clear   HEENT:  Mm pink/moist, no jvd Cardiovascular:  s1s2 rrr, sr on monitor Lungs:  resp's even/non-labored, lungs bilaterally diminished  Abdomen:  Obese/soft, bs x4 active Musculoskeletal:  No acute deformities  Skin:  Warm/dry, trace LE edema  LABS:  CBC  Recent Labs Lab 01/18/14 0408 01/19/14 0325 01/20/14 0423  WBC 3.5* 8.6 9.7  HGB 12.8 11.2* 11.6*  HCT 40.7 35.9* 37.3  PLT 108* 112* 122*   Coag's No results found for this basename: APTT, INR,  in the last 168 hours BMET  Recent Labs Lab 01/18/14 0408 01/19/14 0325 01/20/14 0423  NA 144 140 141  K 3.6* 4.7 4.5  CL 104 103 104  CO2 21 30 30   BUN 13 17 25*  CREATININE 0.81 0.84 0.92  GLUCOSE 241* 172* 143*   Electrolytes  Recent Labs Lab 01/18/14 0408 01/19/14 0325 01/20/14 0423  CALCIUM 7.5* 7.8* 8.3*  MG  --  1.8  --   PHOS  --  2.2*  --    Sepsis Markers  Recent Labs Lab 01/17/14 1840  LATICACIDVEN 1.80   ABG  Recent Labs Lab 01/18/14 0013 01/18/14 0245 01/18/14 1521  PHART 7.165* 7.204* 7.330*  PCO2ART 63.4* 54.5* 54.2*  PO2ART 128.0* 120.0* 70.4*   Liver Enzymes  Recent Labs Lab 01/17/14 1825  AST 21  ALT 16  ALKPHOS 45  BILITOT 3.2*  ALBUMIN 3.7   Cardiac Enzymes  Recent Labs Lab 01/17/14 2325  PROBNP 13392.0*   Glucose  Recent Labs Lab 01/19/14 0816 01/19/14 1151 01/19/14 1715 01/19/14 2203 01/20/14 0754 01/20/14 1148  GLUCAP 221* 183* 138* 120* 159* 108*    Imaging Dg Chest Port 1 View  01/19/2014   CLINICAL DATA:  Infiltrates.  EXAM: PORTABLE CHEST - 1 VIEW  COMPARISON:  01/18/2014  FINDINGS: Marked cardiopericardial enlargement, size and morphology unchanged. Diffuse interstitial opacities, stable from prior but mildly increased from baseline imaging 07/28/2013. No effusion or pneumothorax.  IMPRESSION: Interstitial lung disease, likely with fibrosis. Opacities are above baseline (compared to 07/28/2013), which may reflect mild superimposed edema or atypical  infection.   Electronically Signed   By: Tiburcio Pea M.D.   On: 01/19/2014 06:56    ASSESSMENT / PLAN:  PULMONARY A: Acute Hypoxic/hypercarbic Respiratory Failure  Pulmonary Sarcoidosis with acute flare ?HCAP - more likely sarcoid flare  UA obstruction > playing a role here, improved 2/3 P:   -oxygen to support saturations >92% (baseline 2L, feels like she needs more with exertion) -PRN duoneb -will need O2 set up checked at home after discharge (EMS reported concentrator too far away from patient) -pulmonary hygiene -Continue 40 mg prednisone & taper to  baseline over 2-3 weeks (5mg  M/W/F), s/p 125 medrol in ER -- rx sent  -continue methotrexate  CARDIOVASCULAR A:  Hx HTN Mild CAD - by [facility] in 11/2013 Severe Pulm HTN --  P:  -per [facility]  Agree with diuresis as she can tolerate -could consider PAH treatment in setting sarcoid -- need to consider this as outpt with Dr. Vassie Loll - no indication as inpt   RENAL A:   Mild Hypokalemia P:   -replace as indicated  GASTROINTESTINAL A:   GERD P:   -PPI  HEMATOLOGIC A:   DVT Proph   LE doppler - negative for LE DVT P:  -heparin SQ   INFECTIOUS A:   Fever At Risk Opportunistic Infection / Immunocompromised State - on methotrexate / prednisone ?HCAP  P:   -follow cultures -continue cefepime--> transition to augmentin on discharge x 7ds total  ENDOCRINE A:   Diabetes Mellitus   P:   -SSI per [facility]  NEUROLOGIC A:   No acute process. Hx of syncope / vertigo P:   -monitor, supportive care  Likely near baseline.  Continue cefepime and transition to Augmentin at d/c.  SLOW prednisone taper rx sent.  Likely ready for d/c home in next day or so from pulm standpoint.  Will f/u as outpt (appt made).  PCCM signing off please call back if needed.   Danford Bad, NP 01/20/2014  1:27 PM Pager: [phone] or [phone]  *Care during the described time interval was provided by me and/or other providers on the  critical care team. I have reviewed this patient's available data, including medical history, events of note, physical examination and test results as part of my evaluation.   Levy Pupa, MD, PhD 01/20/2014, 1:28 PM [facility] [phone] or if no answer [phone]

## 2014-01-20 NOTE — Progress Notes (Signed)
Patient ID: [name], female   DOB: July 11, 1936, 78 y.o.   MRN: [id number]  [facility] PROGRESS NOTE  [name] YQI:[id number] DOB: July 11, 1936 DOA: 01/17/2014 PCP: Rene PaciValerie Leschber, MD  Brief narrative:  Patient is [age] year old female with sarcoidosis on chronic steroid therapy, recently discharged from most [place] 12/16/2013 after being admitted for and STEMI, status post cardiac catheter on number 31st 2014 indicative of mild nonobstructive coronary artery disease with normal LV function. She has presented to emergency department on 01/17/2014 with main concern of two-week duration of progressively worsening shortness of breath, mostly exertional, associated with productive cough of yellow sputum, malaise, poor oral intake. In the emergency department, patient found to be in acute hypoxic respiratory failure with oxygen saturation in the 70s. Responded well to administration of oxygen via NRB. Patient also febrile on admission with temperature 102 F.   Active Problems:  Acute hypoxic respiratory failure  - Most likely secondary to healthcare associated pneumonia versus pulmonary sarcoidosis flare  - Continuing vancomycin and Maxipime day 4, will continue and transition to Augmentin in AM prior to discharge  - continue prednisone tapering,slow over 2-3 weeks as recommended by PCCM  - Continue duo nebs scheduled every 4 hours as needed  - appreciate PCCM input  Sarcoidosis  - prednisone taper over 2-3 weeks  HYPERTENSION  - BP stable this morning, off losartan whic was stopped 02/04 due to soft BP - Continue metoprolol  GERD  - Continue protonix  DIABETES MELLITUS, TYPE II, BORDERLINE  - Reasonable inpatient control, continue sliding scale insulin  Coronary artery disease  - 2 D ECHO with grade I diastolic dysfunction  - strict I's and O's, daily weights, weight this AM 190 lbs  - weight trend since admission 2/2 --> 205 lbs --> 195 lbs --> 190 --> 191 lbs  this AM  HCAP (healthcare-associated pneumonia)  - Management with broad-spectrum antibiotics as noted above, vancomycin and Maxipime day 4, followup on sputum culture  - transition to oral ABX in AM  Code Status: Patient is a full code  Family Communication: none at bedside  Disposition Plan: Possible d/c in AM  Consultants:  PCCM --> signed off 02/05 Antibiotics:  Vanc 2/2 --> Cefepime  2/2 --> Procedures/Studies:  CXR 01/19/2014 Interstitial lung disease, likely with fibrosis. Opacities are above baseline (compared to 07/28/2013), which may reflect mild superimposed edema or atypical infection.  CXR 01/18/2014 Diffuse interstitial lung densities suggest chronic changes. Slightly increased densities in both lungs may represent areas of atelectasis. Stable enlargement of the cardiac silhouette.  CXR 01/17/2014 Stable severe chronic interstitial lung disease, likely fibrosis. Stable cardiomegaly without pulmonary edema. No acute cardiopulmonary disease.   HPI/Subjective: No events overnight.   Objective: Filed Vitals:   01/19/14 1341 01/19/14 2015 01/20/14 0647 01/20/14 0850  BP: 97/64 122/75 122/76 119/74  Pulse: 68 78 60 62  Temp: 98.2 F (36.8 C) 98.6 F (37 C) 97.5 F (36.4 C) 97.8 F (36.6 C)  TempSrc: Oral Oral Oral Oral  Resp: 20 18 18 18   Height:      Weight:   86.7 kg (191 lb 2.2 oz)   SpO2: 97% 93% 100% 100%    Intake/Output Summary (Last 24 hours) at 01/20/14 1350 Last data filed at 01/20/14 1030  Gross per 24 hour  Intake    460 ml  Output    200 ml  Net    260 ml    Exam:   General:  Pt is alert, follows commands  appropriately, not in acute distress  Cardiovascular: Regular rate and rhythm, S1/S2, no murmurs, no rubs, no gallops  Respiratory: Diminished breath sounds at bases with mild end expiratory wheezing   Abdomen: Soft, non tender, non distended, bowel sounds present, no guarding  Extremities: No edema, pulses DP and PT palpable  bilaterally  Neuro: Grossly nonfocal  Data Reviewed: Basic Metabolic Panel:  Recent Labs Lab 01/17/14 1825 01/18/14 0408 01/19/14 0325 01/20/14 0423  NA 142 144 140 141  K 3.9 3.6* 4.7 4.5  CL 102 104 103 104  CO2 28 21 30 30   GLUCOSE 113* 241* 172* 143*  BUN 11 13 17  25*  CREATININE 0.84 0.81 0.84 0.92  CALCIUM 8.7 7.5* 7.8* 8.3*  MG  --   --  1.8  --   PHOS  --   --  2.2*  --    Liver Function Tests:  Recent Labs Lab 01/17/14 1825  AST 21  ALT 16  ALKPHOS 45  BILITOT 3.2*  PROT 7.1  ALBUMIN 3.7   CBC:  Recent Labs Lab 01/17/14 1825 01/18/14 0408 01/19/14 0325 01/20/14 0423  WBC 3.6* 3.5* 8.6 9.7  NEUTROABS 3.0  --   --   --   HGB 13.4 12.8 11.2* 11.6*  HCT 41.4 40.7 35.9* 37.3  MCV 96.5 98.8 98.4 98.9  PLT 124* 108* 112* 122*   CBG:  Recent Labs Lab 01/19/14 1151 01/19/14 1715 01/19/14 2203 01/20/14 0754 01/20/14 1148  GLUCAP 183* 138* 120* 159* 108*    Recent Results (from the past 240 hour(s))  CULTURE, BLOOD (ROUTINE X 2)     Status: None   Collection Time    01/17/14  6:25 PM      Result Value Range Status   Specimen Description BLOOD LEFT FOREARM   Final   Special Requests BOTTLES DRAWN AEROBIC AND ANAEROBIC   Final   Culture  Setup Time     Final   Value: 01/17/2014 22:18     Performed at [facility]   Culture     Final   Value:        BLOOD CULTURE RECEIVED NO GROWTH TO DATE CULTURE WILL BE HELD FOR 5 DAYS BEFORE ISSUING A FINAL NEGATIVE REPORT     Performed at [facility]   Report Status PENDING   Incomplete  CULTURE, BLOOD (ROUTINE X 2)     Status: None   Collection Time    01/17/14  6:46 PM      Result Value Range Status   Specimen Description BLOOD RIGHT ARM   Final   Special Requests BOTTLES DRAWN AEROBIC AND ANAEROBIC   Final   Culture  Setup Time     Final   Value: 01/17/2014 22:17     Performed at [facility]   Culture     Final   Value:        BLOOD CULTURE RECEIVED NO GROWTH TO  DATE CULTURE WILL BE HELD FOR 5 DAYS BEFORE ISSUING A FINAL NEGATIVE REPORT     Performed at [facility]   Report Status PENDING   Incomplete  URINE CULTURE     Status: None   Collection Time    01/17/14  7:33 PM      Result Value Range Status   Specimen Description URINE, CLEAN CATCH   Final   Special Requests NONE   Final   Culture  Setup Time     Final   Value:  01/17/2014 22:09     Performed at [facility] Count     Final   Value: NO GROWTH     Performed at [facility]   Culture     Final   Value: NO GROWTH     Performed at [facility]   Report Status 01/18/2014 FINAL   Final  MRSA PCR SCREENING     Status: None   Collection Time    01/17/14 11:08 PM      Result Value Range Status   MRSA by PCR NEGATIVE  NEGATIVE Final   Comment:            The GeneXpert MRSA Assay (FDA     approved for NASAL specimens     only), is one component of a     comprehensive MRSA colonization     surveillance program. It is not     intended to diagnose MRSA     infection nor to guide or     monitor treatment for     MRSA infections.  CULTURE, EXPECTORATED SPUTUM-ASSESSMENT     Status: None   Collection Time    01/18/14  4:08 PM      Result Value Range Status   Specimen Description SPUTUM   Final   Special Requests Immunocompromised   Final   Sputum evaluation     Final   Value: THIS SPECIMEN IS ACCEPTABLE. RESPIRATORY CULTURE REPORT TO FOLLOW.   Report Status 01/18/2014 FINAL   Final  CULTURE, RESPIRATORY (NON-EXPECTORATED)     Status: None   Collection Time    01/18/14  4:08 PM      Result Value Range Status   Specimen Description SPU   Final   Special Requests NONE   Final   Gram Stain     Final   Value: NO WBC SEEN     NO SQUAMOUS EPITHELIAL CELLS SEEN     NO ORGANISMS SEEN     Performed at [facility]   Culture     Final   Value: NORMAL OROPHARYNGEAL FLORA     Performed at [facility]   Report Status PENDING    Incomplete     Scheduled Meds: . antiseptic oral rinse  15 mL Mouth Rinse q12n4p  . aspirin EC  81 mg Oral Daily  . atorvastatin  10 mg Oral q1800  . ceFEPime (MAXIPIME) IV  1 g Intravenous Q8H  . chlorhexidine  15 mL Mouth Rinse BID  . folic acid  0.5 mg Oral QPC breakfast  . heparin  5,000 Units Subcutaneous Q8H  . insulin aspart  0-15 Units Subcutaneous TID WC  . insulin aspart  0-5 Units Subcutaneous QHS  . metoprolol tartrate  12.5 mg Oral BID  . pantoprazole  40 mg Oral Daily  . predniSONE  40 mg Oral Q breakfast   Continuous Infusions:   Debbora Presto, MD  [facility] Pager [phone]  If 7PM-7AM, please contact night-coverage [email or web address] Password [email or web address] 01/20/2014, 1:50 PM   LOS: 3 days

## 2014-01-21 LAB — GLUCOSE, CAPILLARY
GLUCOSE-CAPILLARY: 84 mg/dL (ref 70–99)
Glucose-Capillary: 148 mg/dL — ABNORMAL HIGH (ref 70–99)
Glucose-Capillary: 163 mg/dL — ABNORMAL HIGH (ref 70–99)

## 2014-01-21 LAB — CBC
HCT: 36.5 % (ref 36.0–46.0)
Hemoglobin: 11.5 g/dL — ABNORMAL LOW (ref 12.0–15.0)
MCH: 31 pg (ref 26.0–34.0)
MCHC: 31.5 g/dL (ref 30.0–36.0)
MCV: 98.4 fL (ref 78.0–100.0)
PLATELETS: 121 10*3/uL — AB (ref 150–400)
RBC: 3.71 MIL/uL — ABNORMAL LOW (ref 3.87–5.11)
RDW: 15.7 % — AB (ref 11.5–15.5)
WBC: 6.4 10*3/uL (ref 4.0–10.5)

## 2014-01-21 LAB — CULTURE, RESPIRATORY

## 2014-01-21 LAB — BASIC METABOLIC PANEL
BUN: 20 mg/dL (ref 6–23)
CO2: 32 mEq/L (ref 19–32)
CREATININE: 0.84 mg/dL (ref 0.50–1.10)
Calcium: 8.5 mg/dL (ref 8.4–10.5)
Chloride: 103 mEq/L (ref 96–112)
GFR calc non Af Amer: 66 mL/min — ABNORMAL LOW (ref >90)
GFR, EST AFRICAN AMERICAN: 77 mL/min — AB (ref >90)
Glucose, Bld: 116 mg/dL — ABNORMAL HIGH (ref 70–99)
POTASSIUM: 4.4 meq/L (ref 3.7–5.3)
Sodium: 142 mEq/L (ref 137–147)

## 2014-01-21 LAB — CULTURE, RESPIRATORY W GRAM STAIN
Culture: NORMAL
Gram Stain: NONE SEEN

## 2014-01-21 MED ORDER — IPRATROPIUM-ALBUTEROL 0.5-2.5 (3) MG/3ML IN SOLN: 3.0000 mL | RESPIRATORY_TRACT | Status: AC | PRN

## 2014-01-21 MED ORDER — PREDNISONE 10 MG PO TABS: ORAL_TABLET | ORAL | Status: DC

## 2014-01-21 MED ORDER — AMOXICILLIN-POT CLAVULANATE 875-125 MG PO TABS: 1.0000 | ORAL_TABLET | Freq: Two times a day (BID) | ORAL | Status: DC

## 2014-01-21 MED ORDER — PANTOPRAZOLE SODIUM 40 MG PO TBEC: 40.0000 mg | DELAYED_RELEASE_TABLET | Freq: Every day | ORAL | Status: AC

## 2014-01-21 MED ORDER — TRAMADOL HCL 50 MG PO TABS: 50.0000 mg | ORAL_TABLET | Freq: Three times a day (TID) | ORAL | Status: AC | PRN

## 2014-01-21 NOTE — Progress Notes (Signed)
[name] PCCM   Name: [name] MRN: [id number] DOB: 08/02/1936    ADMISSION DATE:  01/17/2014 CONSULTATION DATE:  01/18/14  REFERRING MD :  Dr. Jomarie Hayes  PRIMARY SERVICE: [facility]  CHIEF COMPLAINT:  Respiratory Failure  BRIEF PATIENT DESCRIPTION: 78 y/o F with PMH of sarcoid on chronic steroids admitted on 2/2 with SOB / Cough, fever & hypoxemic respiratory failure.   SIGNIFICANT EVENTS / STUDIES:  12/30-1/1 - admit with syncopal event, elevated trop, neg heart cath  .................................................................................................................................................................... 2/3 - admit with hypoxic resp failure.  CXR with stable chronic ILD, no acute process 2/4 looks great 2/4 2D echo>>> EF55-60%, grade 1 diastolic dysfunction, severely dilated RV, severe pulm HTN (PA press 82mmHg), severe TR  LINES / TUBES: none  CULTURES: BCx2 2/2>>> UC 2/2>>>neg Sputum 2/2>>>normal flora  UA 2/2>>>rare bacteria, 0-2 wbc, neg nitrate  ANTIBIOTICS: vanco 2/2 >> 2/4 Cefepime 2/2 >>    SUBJECTIVE:  Feeling much better.  Wants to go home.  Some SOB with activity but near baseline. NAD @ rest  VITAL SIGNS: Temp:  [97.5 F (36.4 C)] 97.5 F (36.4 C) (02/06 0507) Pulse Rate:  [60-68] 68 (02/06 0507) Resp:  [18] 18 (02/06 0507) BP: (118-134)/(72-78) 134/72 mmHg (02/06 0507) SpO2:  [93 %-100 %] 93 % (02/06 1045) Weight:  [191 lb 12.8 oz (87 kg)] 191 lb 12.8 oz (87 kg) (02/06 0507)  INTAKE / OUTPUT: Intake/Output     02/05 0701 - 02/06 0700 02/06 0701 - 02/07 0700   P.O. 600    IV Piggyback 150    Total Intake(mL/kg) 750 (8.6)    Urine (mL/kg/hr) 650 (0.3) 200 (0.5)   Total Output 650 200   Net +100 -200        Urine Occurrence 1 x    Stool Occurrence  1 x     PHYSICAL EXAMINATION: General:  Chronically ill in NAD asleep but rouses easily Neuro:  AAOx4, speech clear  HEENT:  Mm pink/moist, no jvd Cardiovascular:  s1s2  rrr, sr on monitor Lungs:  resp's even/non-labored, lungs bilaterally diminished  Abdomen:  Obese/soft, bs x4 active Musculoskeletal:  No acute deformities  Skin:  Warm/dry, trace LE edema  LABS:  CBC  Recent Labs Lab 01/19/14 0325 01/20/14 0423 01/21/14 0356  WBC 8.6 9.7 6.4  HGB 11.2* 11.6* 11.5*  HCT 35.9* 37.3 36.5  PLT 112* 122* 121*   Coag's No results found for this basename: APTT, INR,  in the last 168 hours BMET  Recent Labs Lab 01/19/14 0325 01/20/14 0423 01/21/14 0356  NA 140 141 142  K 4.7 4.5 4.4  CL 103 104 103  CO2 30 30 32  BUN 17 25* 20  CREATININE 0.84 0.92 0.84  GLUCOSE 172* 143* 116*   Electrolytes  Recent Labs Lab 01/19/14 0325 01/20/14 0423 01/21/14 0356  CALCIUM 7.8* 8.3* 8.5  MG 1.8  --   --   PHOS 2.2*  --   --    Sepsis Markers  Recent Labs Lab 01/17/14 1840  LATICACIDVEN 1.80   ABG  Recent Labs Lab 01/18/14 0013 01/18/14 0245 01/18/14 1521  PHART 7.165* 7.204* 7.330*  PCO2ART 63.4* 54.5* 54.2*  PO2ART 128.0* 120.0* 70.4*   Liver Enzymes  Recent Labs Lab 01/17/14 1825  AST 21  ALT 16  ALKPHOS 45  BILITOT 3.2*  ALBUMIN 3.7   Cardiac Enzymes  Recent Labs Lab 01/17/14 2325  PROBNP 13392.0*   Glucose  Recent Labs Lab 01/20/14 0754 01/20/14 1148 01/20/14 1757  01/20/14 2208 01/21/14 0746 01/21/14 1137  GLUCAP 159* 108* 203* 148* 84 163*    Imaging No results found.  ASSESSMENT / PLAN:  PULMONARY A: Acute Hypoxic/hypercarbic Respiratory Failure  Pulmonary Sarcoidosis with acute flare ?HCAP - more likely sarcoid flare  UA obstruction > playing a role here, improved 2/3 P:   -oxygen to support saturations >92% (baseline 2L, feels like she needs more with exertion) -PRN duoneb -will need O2 set up checked at home after discharge (EMS reported concentrator too far away from patient) -pulmonary hygiene -Continue 40 mg prednisone & taper to  baseline over 2-3 weeks (5mg  M/W/F), s/p 125 medrol  in ER -- rx sent  -continue methotrexate  CARDIOVASCULAR A:  Hx HTN Mild CAD - by [facility] in 11/2013 Severe Secondary Pulm HTN --  P:  -per [facility]  Agree with diuresis as she can tolerate -could consider PAH treatment in setting sarcoid -- need to consider this as outpt with [name] - no indication as inpt   RENAL A:   Mild Hypokalemia P:   -replace as indicated  GASTROINTESTINAL A:   GERD P:   -PPI  HEMATOLOGIC A:   DVT Proph   LE doppler - negative for LE DVT P:  -heparin SQ   INFECTIOUS A:   Fever At Risk Opportunistic Infection / Immunocompromised State - on methotrexate / prednisone ?HCAP  P:   -follow cultures -continue cefepime--> transition to augmentin on discharge x 7days total  ENDOCRINE A:   Diabetes Mellitus   P:   -SSI per [facility]  NEUROLOGIC A:   No acute process. Hx of syncope / vertigo P:   -monitor, supportive care  Likely near baseline.  Continue cefepime and transition to Augmentin at d/c.  SLOW prednisone taper rx sent.  Likely ready for d/c home in next day or so from pulm standpoint.  Will f/u w Dr [name] as outpt (appt made).  PCCM signing off please call back if needed.   [name] ACNP [name] PCCM Pager [phone] till 3 pm If no answer page [phone] 01/21/2014, 11:46 AM  Desiree Pupa, MD, PhD 01/21/2014, 12:06 PM [facility] [phone] or if no answer [phone]

## 2014-01-21 NOTE — Discharge Summary (Signed)
Physician Discharge Summary  [name] HYQ:[id number] DOB: 08/11/1936 DOA: 01/17/2014  PCP: Rene Paci, MD  Admit date: 01/17/2014 Discharge date: 01/21/2014  Recommendations for Outpatient Follow-up:  1. Pt will need to follow up with PCP in 2-3 weeks post discharge 2. Please obtain BMP to evaluate electrolytes and kidney function 3. Please also check CBC to evaluate Hg and Hct levels 4. Please note that pt was discharge on Prednisone taper and Augmentin as recommended by her pulmonologist  Discharge Diagnoses: Acute hypoxic respiratory failure secondary to HCAP, sarcoidosis  Active Problems:   Sarcoidosis   HYPERTENSION   GERD   DIABETES MELLITUS, TYPE II, BORDERLINE   HCAP (healthcare-associated pneumonia)   Respiratory failure  Discharge Condition: Stable  Diet recommendation: Heart healthy diet discussed in details   Brief narrative:  Patient is 78 year old female with sarcoidosis on chronic steroid therapy, recently discharged from most [place] 12/16/2013 after being admitted for and STEMI, status post cardiac catheter on number 31st 2014 indicative of mild nonobstructive coronary artery disease with normal LV function. She has presented to emergency department on 01/17/2014 with main concern of two-week duration of progressively worsening shortness of breath, mostly exertional, associated with productive cough of yellow sputum, malaise, poor oral intake. In the emergency department, patient found to be in acute hypoxic respiratory failure with oxygen saturation in the 70s. Responded well to administration of oxygen via NRB. Patient also febrile on admission with temperature 102 F.   Active Problems:  Acute hypoxic respiratory failure  - Most likely secondary to healthcare associated pneumonia versus pulmonary sarcoidosis flare  - Continuing vancomycin and Maxipime day 5, will transition to Augmentin upon discharge as recommended by PCCM specialist  - continue  prednisone tapering,slow over 2-3 weeks as recommended by PCCM  - Continue duo nebs scheduled every 4 hours as needed  Sarcoidosis  - prednisone taper over 2-3 weeks  HYPERTENSION  - BP stable this morning, off losartan whicg was stopped 02/04 due to soft BP  - Continue metoprolol  - As BP is now stable, will resume Losartan upon discharge  GERD  - Continue protonix  DIABETES MELLITUS, TYPE II, BORDERLINE  - Reasonable inpatient control, continued sliding scale insulin  Coronary artery disease  - 2 D ECHO with grade I diastolic dysfunction  - strict I's and O's, daily weights, weight this AM 190 lbs  - weight trend since admission 2/2 --> 205 lbs --> 195 lbs --> 190 --> 191 lbs this AM  HCAP (healthcare-associated pneumonia)  - Management with broad-spectrum antibiotics as noted above - transition to oral ABX Augment upon discharge   Code Status: Patient is a full code   Consultants:  PCCM --> signed off 02/05 Antibiotics:  Vanc 2/2 -->  Cefepime 2/2 --> Procedures/Studies:  CXR 01/19/2014 Interstitial lung disease, likely with fibrosis. Opacities are above baseline (compared to 07/28/2013), which may reflect mild superimposed edema or atypical infection.  CXR 01/18/2014 Diffuse interstitial lung densities suggest chronic changes. Slightly increased densities in both lungs may represent areas of atelectasis. Stable enlargement of the cardiac silhouette.  CXR 01/17/2014 Stable severe chronic interstitial lung disease, likely fibrosis. Stable cardiomegaly without pulmonary edema. No acute cardiopulmonary disease.   Discharge Exam: Filed Vitals:   01/21/14 0507  BP: 134/72  Pulse: 68  Temp: 97.5 F (36.4 C)  Resp: 18   Filed Vitals:   01/20/14 1420 01/20/14 2122 01/21/14 0507 01/21/14 1045  BP: 118/78 132/76 134/72   Pulse: 60 66 68   Temp:  97.5 F (36.4 C) 97.5 F (36.4 C) 97.5 F (36.4 C)   TempSrc: Oral Oral Oral   Resp:  18 18   Height:      Weight:   87 kg (191 lb 12.8  oz)   SpO2: 100% 100% 100% 93%    General: Pt is alert, follows commands appropriately, not in acute distress Cardiovascular: Regular rate and rhythm, S1/S2 +, no murmurs, no rubs, no gallops Respiratory: Clear to auscultation bilaterally, no wheezing, minimal expiratory wheezing bilaterally  Abdominal: Soft, non tender, non distended, bowel sounds +, no guarding Extremities: no edema, no cyanosis, pulses palpable bilaterally DP and PT Neuro: Grossly nonfocal  Discharge Instructions  Discharge Orders   Future Appointments Provider Department Dept Phone   01/28/2014 10:30 AM Julio Sicks, NP [facility] [phone]   Future Orders Complete By Expires   Diet - low sodium heart healthy  As directed    Increase activity slowly  As directed        Medication List         amoxicillin-clavulanate 875-125 MG per tablet  Commonly known as:  AUGMENTIN  Take 1 tablet by mouth 2 (two) times daily.     aspirin EC 81 MG tablet  Take 1 tablet (81 mg total) by mouth daily.     atorvastatin 10 MG tablet  Commonly known as:  LIPITOR  Take 1 tablet (10 mg total) by mouth daily at 6 PM.     BLUE-EMU MAXIMUM STRENGTH 2.5 % Liqd  Generic drug:  Menthol (Topical Analgesic)  Apply 1 application topically daily as needed (for k nee pain).     CENTRUM SILVER PO  Take 1 tablet by mouth daily.     folic acid 1 MG tablet  Commonly known as:  FOLVITE  Take 0.5 mg by mouth daily after breakfast.     hydrochlorothiazide 12.5 MG capsule  Commonly known as:  MICROZIDE  Take 12.5 mg by mouth daily as needed (for BP).     ipratropium-albuterol 0.5-2.5 (3) MG/3ML Soln  Commonly known as:  DUONEB  Take 3 mLs by nebulization every 4 (four) hours as needed.     losartan 25 MG tablet  Commonly known as:  COZAAR  Take 1 tablet (25 mg total) by mouth daily.     methotrexate 2.5 MG tablet  Commonly known as:  RHEUMATREX  Take 10 mg by mouth once a week. On Mondays     metoprolol  tartrate 25 MG tablet  Commonly known as:  LOPRESSOR  Take 0.5 tablets (12.5 mg total) by mouth 2 (two) times daily.     nitroGLYCERIN 0.4 MG SL tablet  Commonly known as:  NITROSTAT  Place 1 tablet (0.4 mg total) under the tongue every 5 (five) minutes x 3 doses as needed for chest pain.     pantoprazole 40 MG tablet  Commonly known as:  PROTONIX  Take 1 tablet (40 mg total) by mouth daily.     predniSONE 5 MG tablet  Commonly known as:  DELTASONE  Take 1 tablet on Mon, Wed, friday     predniSONE 10 MG tablet  Commonly known as:  DELTASONE  4 tabs PO daily x 5days then 3 tabs PO daily x 5 days then 2 tabs PO daily x 5days then 1 tab PO daily x 5 days then resume 5mg  m/w/f     traMADol 50 MG tablet  Commonly known as:  ULTRAM  Take 1 tablet (50 mg total)  by mouth every 8 (eight) hours as needed for moderate pain or severe pain.     Vitamin D 2000 UNITS Caps  Take 1 capsule by mouth daily.           Follow-up Information   Follow up with PARRETT,TAMMY, NP On 01/28/2014. (10:30am )    Specialty:  Nurse Practitioner   Contact information:   [address] [place] [address] [phone]       Schedule an appointment as soon as possible for a visit with Rene Paci, MD.   Specialty:  Internal Medicine   Contact information:   [address] [place] [address] [phone]       Follow up with Debbora Presto, MD. (As needed)    Specialty:  Internal Medicine   Contact information:   [address] [place] [address] [phone]        The results of significant diagnostics from this hospitalization (including imaging, microbiology, ancillary and laboratory) are listed below for reference.     Microbiology: Recent Results (from the past 240 hour(s))  CULTURE, BLOOD (ROUTINE X 2)     Status: None   Collection Time    01/17/14  6:25 PM      Result Value Range Status   Specimen Description BLOOD LEFT FOREARM    Final   Special Requests BOTTLES DRAWN AEROBIC AND ANAEROBIC   Final   Culture  Setup Time     Final   Value: 01/17/2014 22:18     Performed at [facility]   Culture     Final   Value:        BLOOD CULTURE RECEIVED NO GROWTH TO DATE CULTURE WILL BE HELD FOR 5 DAYS BEFORE ISSUING A FINAL NEGATIVE REPORT     Performed at [facility]   Report Status PENDING   Incomplete  CULTURE, BLOOD (ROUTINE X 2)     Status: None   Collection Time    01/17/14  6:46 PM      Result Value Range Status   Specimen Description BLOOD RIGHT ARM   Final   Special Requests BOTTLES DRAWN AEROBIC AND ANAEROBIC   Final   Culture  Setup Time     Final   Value: 01/17/2014 22:17     Performed at [facility]   Culture     Final   Value:        BLOOD CULTURE RECEIVED NO GROWTH TO DATE CULTURE WILL BE HELD FOR 5 DAYS BEFORE ISSUING A FINAL NEGATIVE REPORT     Performed at [facility]   Report Status PENDING   Incomplete  URINE CULTURE     Status: None   Collection Time    01/17/14  7:33 PM      Result Value Range Status   Specimen Description URINE, CLEAN CATCH   Final   Special Requests NONE   Final   Culture  Setup Time     Final   Value: 01/17/2014 22:09     Performed at [facility] Count     Final   Value: NO GROWTH     Performed at [facility]   Culture     Final   Value: NO GROWTH     Performed at [facility]   Report Status 01/18/2014 FINAL   Final  MRSA PCR SCREENING     Status: None  Collection Time    01/17/14 11:08 PM      Result Value Range Status   MRSA by PCR NEGATIVE  NEGATIVE Final   Comment:            The GeneXpert MRSA Assay (FDA     approved for NASAL specimens     only), is one component of a     comprehensive MRSA colonization     surveillance program. It is not     intended to diagnose MRSA     infection nor to guide or     monitor treatment for     MRSA infections.  CULTURE, EXPECTORATED  SPUTUM-ASSESSMENT     Status: None   Collection Time    01/18/14  4:08 PM      Result Value Range Status   Specimen Description SPUTUM   Final   Special Requests Immunocompromised   Final   Sputum evaluation     Final   Value: THIS SPECIMEN IS ACCEPTABLE. RESPIRATORY CULTURE REPORT TO FOLLOW.   Report Status 01/18/2014 FINAL   Final  CULTURE, RESPIRATORY (NON-EXPECTORATED)     Status: None   Collection Time    01/18/14  4:08 PM      Result Value Range Status   Specimen Description SPU   Final   Special Requests NONE   Final   Gram Stain     Final   Value: NO WBC SEEN     NO SQUAMOUS EPITHELIAL CELLS SEEN     NO ORGANISMS SEEN     [facility]formed at [facility]   Culture     Final   Value: NORMAL OROPHARYNGEAL FLORA     Performed at [facility]   Report Status 01/21/2014 FINAL   Final     Labs: Basic Metabolic Panel:  Recent Labs Lab 01/17/14 1825 01/18/14 0408 01/19/14 0325 01/20/14 0423 01/21/14 0356  NA 142 144 140 141 142  K 3.9 3.6* 4.7 4.5 4.4  CL 102 104 103 104 103  CO2 28 21 30 30  32  GLUCOSE 113* 241* 172* 143* 116*  BUN 11 13 17  25* 20  CREATININE 0.84 0.81 0.84 0.92 0.84  CALCIUM 8.7 7.5* 7.8* 8.3* 8.5  MG  --   --  1.8  --   --   PHOS  --   --  2.2*  --   --    Liver Function Tests:  Recent Labs Lab 01/17/14 1825  AST 21  ALT 16  ALKPHOS 45  BILITOT 3.2*  PROT 7.1  ALBUMIN 3.7   CBC:  Recent Labs Lab 01/17/14 1825 01/18/14 0408 01/19/14 0325 01/20/14 0423 01/21/14 0356  WBC 3.6* 3.5* 8.6 9.7 6.4  NEUTROABS 3.0  --   --   --   --   HGB 13.4 12.8 11.2* 11.6* 11.5*  HCT 41.4 40.7 35.9* 37.3 36.5  MCV 96.5 98.8 98.4 98.9 98.4  PLT 124* 108* 112* 122* 121*    BNP (last 3 results)  Recent Labs  01/17/14 2325  PROBNP 13392.0*   CBG:  Recent Labs Lab 01/20/14 1148 01/20/14 1757 01/20/14 2208 01/21/14 0746 01/21/14 1137  GLUCAP 108* 203* 148* 84 163*     SIGNED: Time coordinating discharge: Over 30  minutes  Debbora PrestoMAGICK-Yamina Lenis, MD  [facility] 01/21/2014, 1:29 PM Pager [phone]  If 7PM-7AM, please contact night-coverage [email or web address] Password [email or web address]

## 2014-01-21 NOTE — Progress Notes (Signed)
Pt selected [facility] for Home Health needs. [facility]

## 2014-01-21 NOTE — Progress Notes (Signed)
ANTIBIOTIC CONSULT NOTE - Follow Up  Pharmacy Consult for cefepime Indication: rule out pneumonia  No Known Allergies  Patient Measurements: Height: 5\' 7"  (170.2 cm) Weight: 191 lb 12.8 oz (87 kg) IBW/kg (Calculated) : 61.6  Vital Signs: Temp: 97.5 F (36.4 C) (02/06 0507) Temp src: Oral (02/06 0507) BP: 134/72 mmHg (02/06 0507) Pulse Rate: 68 (02/06 0507) Intake/Output from previous day: 02/05 0701 - 02/06 0700 In: 750 [P.O.:600; IV Piggyback:150] Out: 650 [Urine:650] Intake/Output from this shift: Total I/O In: -  Out: 200 [Urine:200]  Labs:  Recent Labs  01/19/14 0325 01/20/14 0423 01/21/14 0356  WBC 8.6 9.7 6.4  HGB 11.2* 11.6* 11.5*  PLT 112* 122* 121*  CREATININE 0.84 0.92 0.84   Estimated Creatinine Clearance: 65.6 ml/min (by C-G formula based on Cr of 0.84). No results found for this basename: VANCOTROUGH, VANCOPEAK, VANCORANDOM, GENTTROUGH, GENTPEAK, GENTRANDOM, TOBRATROUGH, TOBRAPEAK, TOBRARND, AMIKACINPEAK, AMIKACINTROU, AMIKACIN,  in the last 72 hours   Microbiology: 2/2 blood x1: ngtd 2/2 urine: NGF 2/2 influenza: negative 2/3 sputum: Normal flora  Assessment: [age] YOF presents with fever and shortness of breath with hypoxia secondary to healthcare associated pneumonia versus pulmonary sarcoidosis flare.  Patient was started on IV vancomycin and Cefepime.    2/2 >> vancomycin  >> 2/4 2/2 >> cefepime >>    Afebrile now WBCs: 6.4 Renal: stable.  SCr = 0.84 for est CrCl = 76 ml/min (C-G) and 65 ml/min (normalized)  Goal of Therapy:  Eradication of infection 8 day treatment for HCAP  Plan:  Continue Cefepime 1g IV q8h (day #5/8).  F/u MD plan to transition to Augmenting for discharge.  Clance BollAmanda Gracelin Weisberg, PharmD, BCPS Pager: [phone] 01/21/2014 1:13 PM

## 2014-01-21 NOTE — Progress Notes (Signed)
PT Cancellation Note  Patient Details Name: [name] MRN: [id number] DOB: 06-Oct-1936   Cancelled Treatment:    Reason Eval/Treat Not Completed: Fatigue/lethargy limiting ability to participate pt reports getting no sleep last night and being very tired.  Pt wished to eat breakfast and for PT to return ("but I make no promises" for mobilizing upon PT return).  Checked back later this morning and pt sleeping.  Will check back as schedule permits.   Auriah Hollings,KATHrine E 01/21/2014, 11:53 AM Zenovia JarredKati Pascual Mantel, PT, DPT 01/21/2014 Pager: [phone]

## 2014-01-21 NOTE — Discharge Instructions (Signed)

## 2014-01-23 LAB — CULTURE, BLOOD (ROUTINE X 2)
CULTURE: NO GROWTH
Culture: NO GROWTH

## 2014-01-26 ENCOUNTER — Encounter: Payer: Self-pay | Admitting: Adult Health

## 2014-01-26 ENCOUNTER — Ambulatory Visit (INDEPENDENT_AMBULATORY_CARE_PROVIDER_SITE_OTHER)
Admission: RE | Admit: 2014-01-26 | Discharge: 2014-01-26 | Disposition: A | Discharge status: Home / Self Care | Payer: Medicare Other | Source: Ambulatory Visit | Attending: Adult Health | Admitting: Adult Health

## 2014-01-26 ENCOUNTER — Ambulatory Visit (INDEPENDENT_AMBULATORY_CARE_PROVIDER_SITE_OTHER): Payer: Medicare Other | Admitting: Adult Health

## 2014-01-26 VITALS — BP 140/90 | HR 79 | Temp 97.8°F | Ht 67.0 in | Wt 187.8 lb

## 2014-01-26 DIAGNOSIS — J440 Chronic obstructive pulmonary disease with acute lower respiratory infection: Secondary | ICD-10-CM

## 2014-01-26 DIAGNOSIS — J189 Pneumonia, unspecified organism: Secondary | ICD-10-CM

## 2014-01-26 DIAGNOSIS — D869 Sarcoidosis, unspecified: Secondary | ICD-10-CM

## 2014-01-26 NOTE — Patient Instructions (Signed)
Follow up Dr. Vassie LollAlva  In 6-8 weeks and As needed   Chest xray today  Finish Augmentin as planned  Taper Prednisone as directed -down to maintenance dose of 5mg  M/WF .  Please contact office for sooner follow up if symptoms do not improve or worsen or seek emergency care

## 2014-01-26 NOTE — Progress Notes (Signed)
Subjective:    Patient ID: [name], female    DOB: 06/09/1936, 78 y.o.   MRN: [id number]  HPI   78 year old with sarcoidosis involving her skin and lungs. Good response to 10 mg of prednisone , her dyspnea improved, and her skin lesions resolved.  Chest x-ray showed worsening of bilateral parenchymal scarring with chronic fibrotic changes. ACE level was 129.  10/09 dyspnea persists, desatn on exertion, FVC 52 %, TLC 57%, DLCO 32 % >> decreased  Spirometry 10/09 >> FVC decreased to 52% from 63% in 4/09, DLCO 32%. Started methotrexate 5 mg q sunday with LFT monitoring  05/03/2013 Acute OV  Complains of increased SOB esp w/ exertion, prod cough with clear mucus, decreased energy x1 week.  Denies chest tightness, wheezing, f/c/s, recent travel or abx  O2 tank not working on arrival to office . Placed on our O2 tank with nml sats on 2 l/m  No increased edema. No orthopnea.  Some rash on hands.  Continue on MTX 3 tab weekly.  CXR 03/2012 chronic changes, no acute process.  >>pred taper   07/28/2013 Follow up  Returns for follow up for Sarcoid  Reports breathing is doing better today then perviously. Would like to discuss getting a portable concentrator.Finished pred Sunday 8/3.  Denies SOB, wheezing, cough and tightness in chest.  MTX was increased last month and pred 5mg  daily started  Needs new rx for prednisone.  No flare of cough , dyspnea or rash.    09/17/13> Pt reports her breathing is better than last OV. Has a dry cough at night. denies any PND, no chest tx, no wheezing. Pt entered exam room 86% RA. After sitting x 5 minutes she recovered to 90%. Pt uses O2 at home PRN.  CXR 07/2013 worsening adenopathy , scarring not definitely changed from 03/12/2012 Tramadol works well for arthritis >>decrease pred   01/26/2014 Post Hospital follow up  Patient returns for a post hospital followup. Patient was admitted February 2 through February 6 - acute hypoxic respiratory failure  secondary to healthcare associated pneumonia with underlying sarcoidosis. Chest x-ray showed diffuse interstitial lung disease slightly increased in both lungs.  Patient did require initial BiPAP support and was transitioned over to nasal cannula. . Patient was treated with aggressive IV antibiotics, and transitioned to Augmentin prior to discharge. She was treated with steroid burst, and discharged on a slow prednisone taper. Prior to admission. Patient was on prednisone 5 mg 3 times weekly. Patient did have a recent hospital admission in which Patient had a syncopal episode with associated chest pain. She was seen by cardiology and underwent a cardiac catheterization, which showed minimum obstructive disease. Since discharge. Patient is feeling better.  She is currently on prednisone 40mg  daily w/ taper dose in place.  She denies any hemoptysis, orthopnea, PND, fever, or increased leg swelling. Pt is upset that she had 2 hospital stays and had to pay EMS b/c she is adamant that she was not sick. And her family was being overprotective. Tried to provide support and review hospital records- w/out success.     Review of Systems  neg for any significant sore throat, dysphagia, itching, sneezing, nasal congestion or excess/ purulent secretions, fever, chills, sweats, unintended wt loss, pleuritic or exertional cp, hempoptysis, orthopnea pnd or change in chronic leg swelling. Also denies presyncope, palpitations, heartburn, abdominal pain, nausea, vomiting, diarrhea or change in bowel or urinary habits, dysuria,hematuria, rash, arthralgias, visual complaints, headache, numbness weakness or ataxia.  Objective:   Physical Exam   Gen. Pleasant, elderly  in no distress ENT - no lesions, no post nasal drip Neck: No JVD, no thyromegaly, no carotid bruits Lungs: no use of accessory muscles, no dullness to percussion, bibasal rales, no rhonchi  Cardiovascular: Rhythm regular, heart sounds  normal, no  murmurs or gallops, no peripheral edema Musculoskeletal: No deformities, no cyanosis or clubbing   CXR 01/26/14 Improved appearance since the prior study, with less prominent  interstitial opacities. This would suggest resolved edema in the  interval.  2. No convincing pneumonia or residual pulmonary edema.  3. Stable cardiomegaly.  4. Stable interstitial lung scarring most evident in the right upper  lobe.       Assessment & Plan:

## 2014-01-27 ENCOUNTER — Telehealth: Payer: Self-pay

## 2014-01-27 NOTE — Telephone Encounter (Signed)
Pt aware of cxr results and recs.  Nothing further needed. 

## 2014-01-27 NOTE — Telephone Encounter (Signed)
Message copied by Velvet BatheAULFIELD, Sanika Brosious L on Thu Jan 27, 2014 11:36 AM ------      Message from: Julio SicksPARRETT, TAMMY S      Created: Thu Jan 27, 2014 10:57 AM       CXR is improved       Cont w/ ov recs       Please contact office for sooner follow up if symptoms do not improve or worsen or seek emergency care       follow up as planned As needed         ------

## 2014-01-27 NOTE — Assessment & Plan Note (Signed)
Recent flare w/ HCAP   Plan  Follow up Dr. Vassie LollAlva  In 6-8 weeks and As needed   Taper Prednisone as directed -down to maintenance dose of 5mg  M/WF .  please contact office for sooner follow up if symptoms do not improve or worsen or seek emergency care

## 2014-01-27 NOTE — Assessment & Plan Note (Addendum)
Improved clinically , cxr improved   Plan  Follow up Dr. Vassie LollAlva  In 6-8 weeks and As needed    Finish Augmentin as planned  Taper Prednisone as directed -down to maintenance dose of 5mg  M/WF .  Please contact office for sooner follow up if symptoms do not improve or worsen or seek emergency care

## 2014-01-28 ENCOUNTER — Inpatient Hospital Stay: Payer: Medicare Other | Admitting: Adult Health

## 2014-01-31 ENCOUNTER — Encounter: Payer: Self-pay | Admitting: Internal Medicine

## 2014-01-31 ENCOUNTER — Ambulatory Visit (INDEPENDENT_AMBULATORY_CARE_PROVIDER_SITE_OTHER): Payer: Medicare Other | Admitting: Internal Medicine

## 2014-01-31 ENCOUNTER — Other Ambulatory Visit (INDEPENDENT_AMBULATORY_CARE_PROVIDER_SITE_OTHER): Payer: Medicare Other

## 2014-01-31 VITALS — BP 132/94 | HR 81 | Temp 97.2°F | Wt 182.0 lb

## 2014-01-31 DIAGNOSIS — R7309 Other abnormal glucose: Secondary | ICD-10-CM

## 2014-01-31 DIAGNOSIS — J189 Pneumonia, unspecified organism: Secondary | ICD-10-CM

## 2014-01-31 DIAGNOSIS — D869 Sarcoidosis, unspecified: Secondary | ICD-10-CM

## 2014-01-31 DIAGNOSIS — J9600 Acute respiratory failure, unspecified whether with hypoxia or hypercapnia: Secondary | ICD-10-CM

## 2014-01-31 DIAGNOSIS — J9690 Respiratory failure, unspecified, unspecified whether with hypoxia or hypercapnia: Secondary | ICD-10-CM

## 2014-01-31 LAB — CBC WITH DIFFERENTIAL/PLATELET
BASOS ABS: 0.1 10*3/uL (ref 0.0–0.1)
Basophils Relative: 0.7 % (ref 0.0–3.0)
EOS ABS: 0 10*3/uL (ref 0.0–0.7)
Eosinophils Relative: 0.2 % (ref 0.0–5.0)
HCT: 47.3 % — ABNORMAL HIGH (ref 36.0–46.0)
HEMOGLOBIN: 14.9 g/dL (ref 12.0–15.0)
Lymphs Abs: 0.7 10*3/uL (ref 0.7–4.0)
MCHC: 31.5 g/dL (ref 30.0–36.0)
MCV: 96.9 fl (ref 78.0–100.0)
MONOS PCT: 2.5 % — AB (ref 3.0–12.0)
Monocytes Absolute: 0.3 10*3/uL (ref 0.1–1.0)
NEUTROS ABS: 10.5 10*3/uL — AB (ref 1.4–7.7)
Neutrophils Relative %: 90.5 % — ABNORMAL HIGH (ref 43.0–77.0)
Platelets: 311 10*3/uL (ref 150.0–400.0)
RBC: 4.88 Mil/uL (ref 3.87–5.11)
RDW: 16.6 % — ABNORMAL HIGH (ref 11.5–14.6)
WBC: 11.6 10*3/uL — ABNORMAL HIGH (ref 4.5–10.5)

## 2014-01-31 LAB — BASIC METABOLIC PANEL
BUN: 15 mg/dL (ref 6–23)
CALCIUM: 9.2 mg/dL (ref 8.4–10.5)
CO2: 37 mEq/L — ABNORMAL HIGH (ref 19–32)
CREATININE: 0.6 mg/dL (ref 0.4–1.2)
Chloride: 99 mEq/L (ref 96–112)
GFR: 118.42 mL/min (ref >60.00)
GLUCOSE: 89 mg/dL (ref 70–99)
Potassium: 4.4 mEq/L (ref 3.5–5.1)
SODIUM: 143 meq/L (ref 135–145)

## 2014-01-31 NOTE — Progress Notes (Signed)
Pre-visit discussion using our clinic review tool. No additional management support is needed unless otherwise documented below in the visit note.  

## 2014-01-31 NOTE — Patient Instructions (Signed)
It was good to see you today.  We have reviewed your prior records including labs and tests today  Medications reviewed and updated -No changes recommended   Test(s) ordered today. Your results will be released to MyChart (or called to you) after review, usually within 72hours after test completion. If any changes need to be made, you will be notified at that same time.  Please schedule followup in 3-4 months, call sooner if problems.

## 2014-01-31 NOTE — Assessment & Plan Note (Signed)
Diet controlled, exacerbated by freq pred for pulm dz Does not check cbgs Check a1c q3-6mo to monitor same Lab Results  Component Value Date   HGBA1C 5.9* 01/17/2014   

## 2014-01-31 NOTE — Assessment & Plan Note (Signed)
Following with pulm - dx 2004 On MTX and tapering pred -  chronic hypoxic resp failure related to same continue folic acid daily with chronic MTX Acute exacerbation related to health care acquired pneumonia February 2015 reviewed, now returned to baseline

## 2014-01-31 NOTE — Progress Notes (Signed)
Subjective:    Patient ID: [name], female    DOB: 1936-06-27, 78 y.o.   MRN: [id number]  HPI  Here for follow up - reviewed chronic medical issues and interval medical events Admit date: 01/17/2014  Discharge date: 01/21/2014  Recommendations for Outpatient Follow-up:  1. Pt will need to follow up with PCP in 2-3 weeks post discharge 2. Please obtain BMP to evaluate electrolytes and kidney function 3. Please also check CBC to evaluate Hg and Hct levels 4. Please note that pt was discharge on Prednisone taper and Augmentin as recommended by her pulmonologist Discharge Diagnoses: Acute hypoxic respiratory failure secondary to HCAP, sarcoidosis  Active Problems:  Sarcoidosis  HYPERTENSION  GERD  DIABETES MELLITUS, TYPE II, BORDERLINE  HCAP (healthcare-associated pneumonia)  Respiratory failure  Also reviewed chronic medical issues:  Sarcoid, associated with chronic hypoxic respiratory failure. On methotrexate and prednisone for management of same. Denies current flares of cough and chest congestion (since resolution of hop symptoms last week)  Hypertension - the patient reports compliance with medication(s) as prescribed. Report symptomatic hypotension if taken every day.   steroid induced diabetes. Reports exacerbation during hospitalization requiring sliding scale insulin. Does not check home CBGs and one week away from baseline prednisone dose  Past Medical History  Diagnosis Date  . Sarcoidosis     hypoxic resp failure, follows with pulm for same  . Hypertension   . Allergic rhinitis, cause unspecified   . Esophageal reflux   . Vertigo   . Unspecified vitamin D deficiency   . OSTEOPENIA   . DIABETES MELLITUS, TYPE II, BORDERLINE   . Osteoarthritis     s/p TKR x 2 right  . CAD (coronary artery disease)     a. 11/2013 elevated troponin in setting of presyncope->Cath: LM nl, LAD 35m, LCX nl, RCA 10-20 diff, EF 55-65%.    Review of Systems  Constitutional:  Negative for fever and fatigue.  Respiratory: Positive for shortness of breath (chronic). Negative for cough.   Cardiovascular: Negative for chest pain and leg swelling.  Neurological: Negative for dizziness and headaches.  Psychiatric/Behavioral: Positive for sleep disturbance and dysphoric mood (spouse passed 11/18/13). Negative for suicidal ideas and self-injury.       Objective:   Physical Exam  BP 132/94  Pulse 81  Temp(Src) 97.2 F (36.2 C) (Oral)  Wt 182 lb (82.555 kg)  SpO2 97% Wt Readings from Last 3 Encounters:  01/31/14 182 lb (82.555 kg)  01/26/14 187 lb 12.8 oz (85.186 kg)  01/21/14 191 lb 12.8 oz (87 kg)   Constitutional: She appears well-developed and well-nourished. No distress. Wearing [place] O2 - G-son at side Neck: Normal range of motion. Neck supple. No JVD present. No thyromegaly present.  Cardiovascular: Normal rate, regular rhythm and normal heart sounds.  No murmur heard. No BLE edema. Pulmonary/Chest: Effort normal with diminished breath sounds. She has no wheezes but R>L base crackles.   Psychiatric: She has a normal mood and affect. Her behavior is normal. Judgment and thought content normal.   Lab Results  Component Value Date   WBC 6.4 01/21/2014   HGB 11.5* 01/21/2014   HCT 36.5 01/21/2014   PLT 121* 01/21/2014   GLUCOSE 116* 01/21/2014   CHOL 131 08/11/2013   TRIG 65.0 08/11/2013   HDL 42.50 08/11/2013   LDLCALC 76 08/11/2013   ALT 16 01/17/2014   AST 21 01/17/2014   NA 142 01/21/2014   K 4.4 01/21/2014   CL 103 01/21/2014  CREATININE 0.84 01/21/2014   BUN 20 01/21/2014   CO2 32 01/21/2014   TSH 0.78 08/11/2013   INR 1.16 12/15/2013   HGBA1C 5.9* 01/17/2014      Assessment & Plan:   Problem List Items Addressed This Visit   DIABETES MELLITUS, TYPE II, BORDERLINE      Diet controlled, exacerbated by freq pred for pulm dz Does not check cbgs Check a1c q3-3648mo to monitor same Lab Results  Component Value Date   HGBA1C 5.9* 01/17/2014      Relevant Orders       Basic metabolic panel   HCAP (healthcare-associated pneumonia) - Primary     Hospitalization for same early February 2015 reviewed Productive sputum has resolved, breathing back to baseline Chronic hypoxic resp failure at baseline (underlying sarcoidosis) Reviewed recent visit with pulmonary, no treatment changes recommended    Relevant Orders      Basic metabolic panel      CBC with Differential   Respiratory failure   Relevant Orders      Basic metabolic panel   Sarcoidosis     Following with pulm - dx 2004 On MTX and tapering pred -  chronic hypoxic resp failure related to same continue folic acid daily with chronic MTX Acute exacerbation related to health care acquired pneumonia February 2015 reviewed, now returned to baseline    Relevant Orders      Basic metabolic panel      CBC with Differential      Time spent with pt/family today 30 minutes, greater than 50% time spent counseling patient on hospitalization, chronic issues and medication review. Also review of hospital records

## 2014-01-31 NOTE — Assessment & Plan Note (Signed)
Hospitalization for same early February 2015 reviewed Productive sputum has resolved, breathing back to baseline Chronic hypoxic resp failure at baseline (underlying sarcoidosis) Reviewed recent visit with pulmonary, no treatment changes recommended

## 2014-02-20 ENCOUNTER — Other Ambulatory Visit: Payer: Self-pay | Admitting: Pulmonary Disease

## 2014-03-22 ENCOUNTER — Other Ambulatory Visit: Payer: Self-pay | Admitting: Internal Medicine

## 2014-04-01 ENCOUNTER — Other Ambulatory Visit: Payer: Self-pay | Admitting: Pulmonary Disease

## 2014-04-05 IMAGING — CR DG CHEST 1V PORT
1 series · 1 of 1 positions shown · non-contrast
Comparison: 01/18/2014

CLINICAL DATA: Infiltrates.

EXAM:
PORTABLE CHEST - 1 VIEW

[AP]
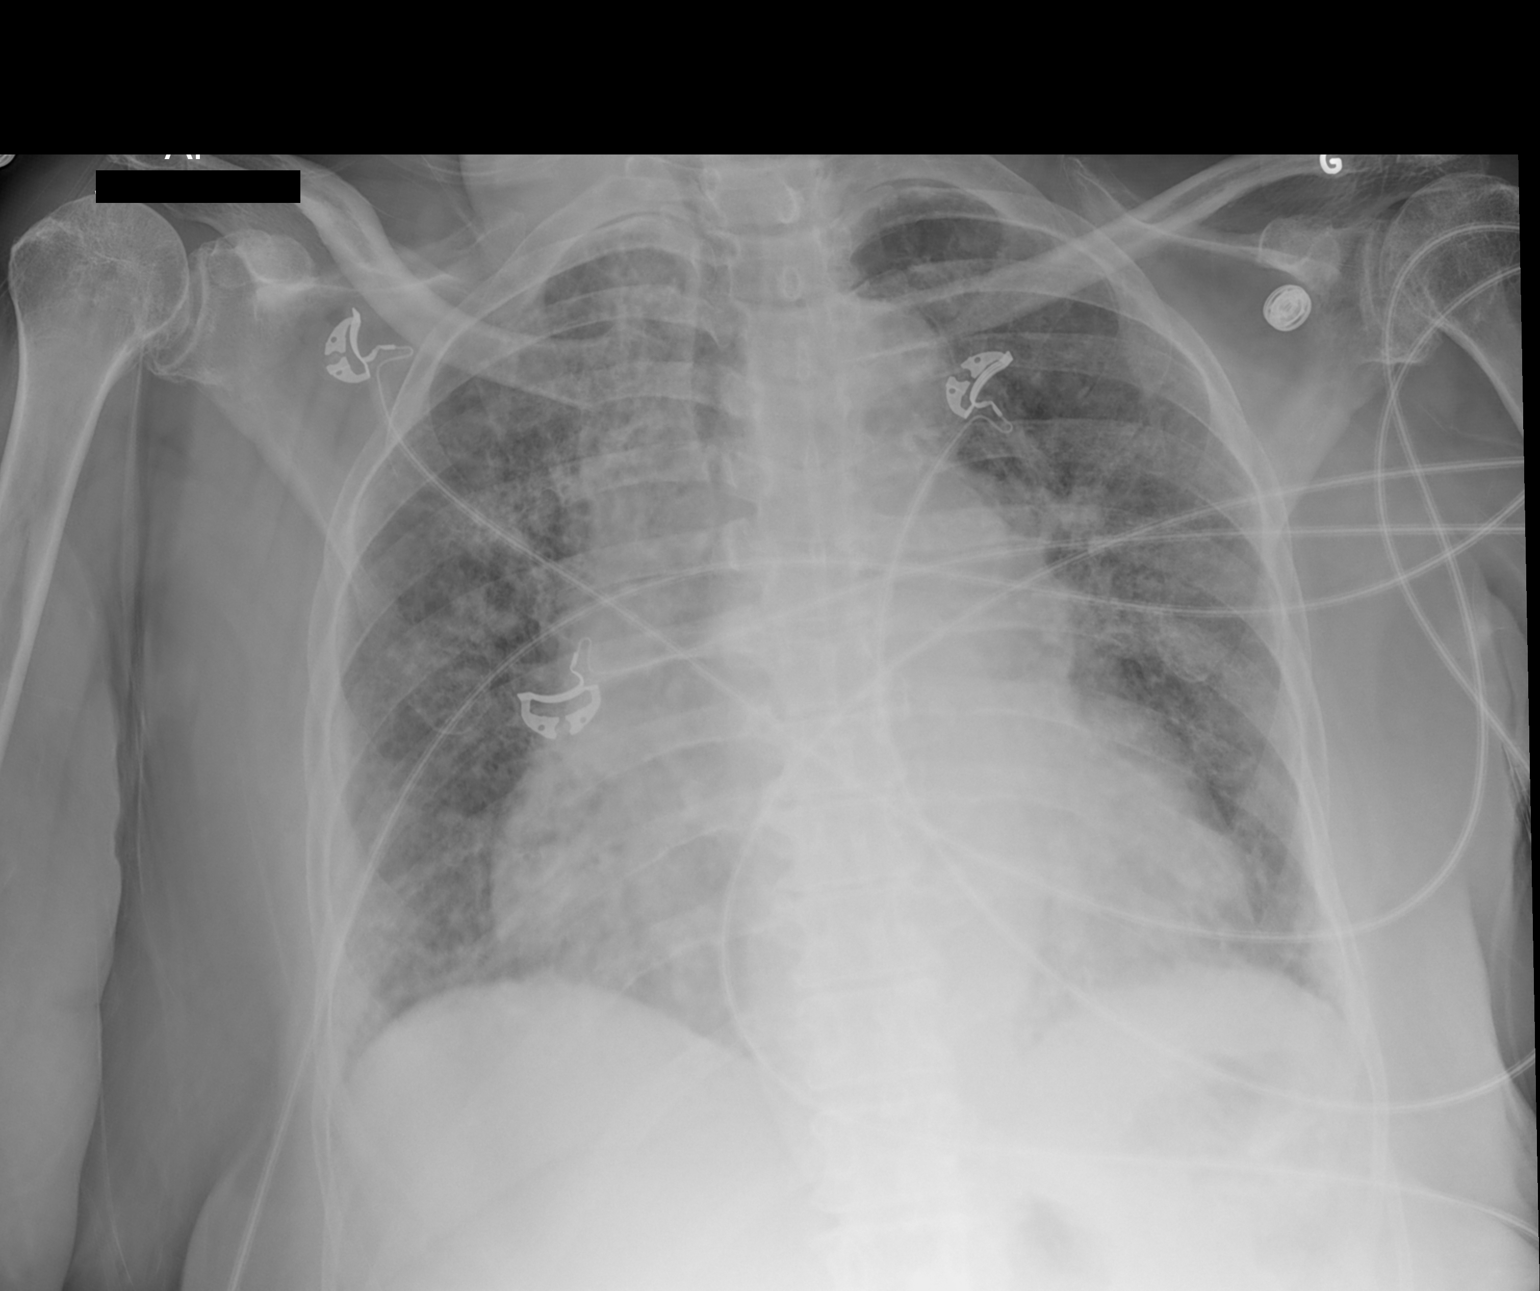

[1 of 1 positions shown; findings below may reference images not displayed]

FINDINGS: Marked cardiopericardial enlargement, size and morphology unchanged.
Diffuse interstitial opacities, stable from prior but mildly
increased from baseline imaging 07/28/2013. No effusion or
pneumothorax.
IMPRESSION: Interstitial lung disease, likely with fibrosis. Opacities are above
baseline (compared to 07/28/2013), which may reflect mild
superimposed edema or atypical infection.

## 2014-04-12 IMAGING — CR DG CHEST 2V
2 series · 2 of 2 positions shown · non-contrast
Comparison: None.

CLINICAL DATA: Followup pneumonia 01/19/2014. History of
sarcoidosis.

EXAM:
CHEST  2 VIEW

[view not recorded (1 of 2)]
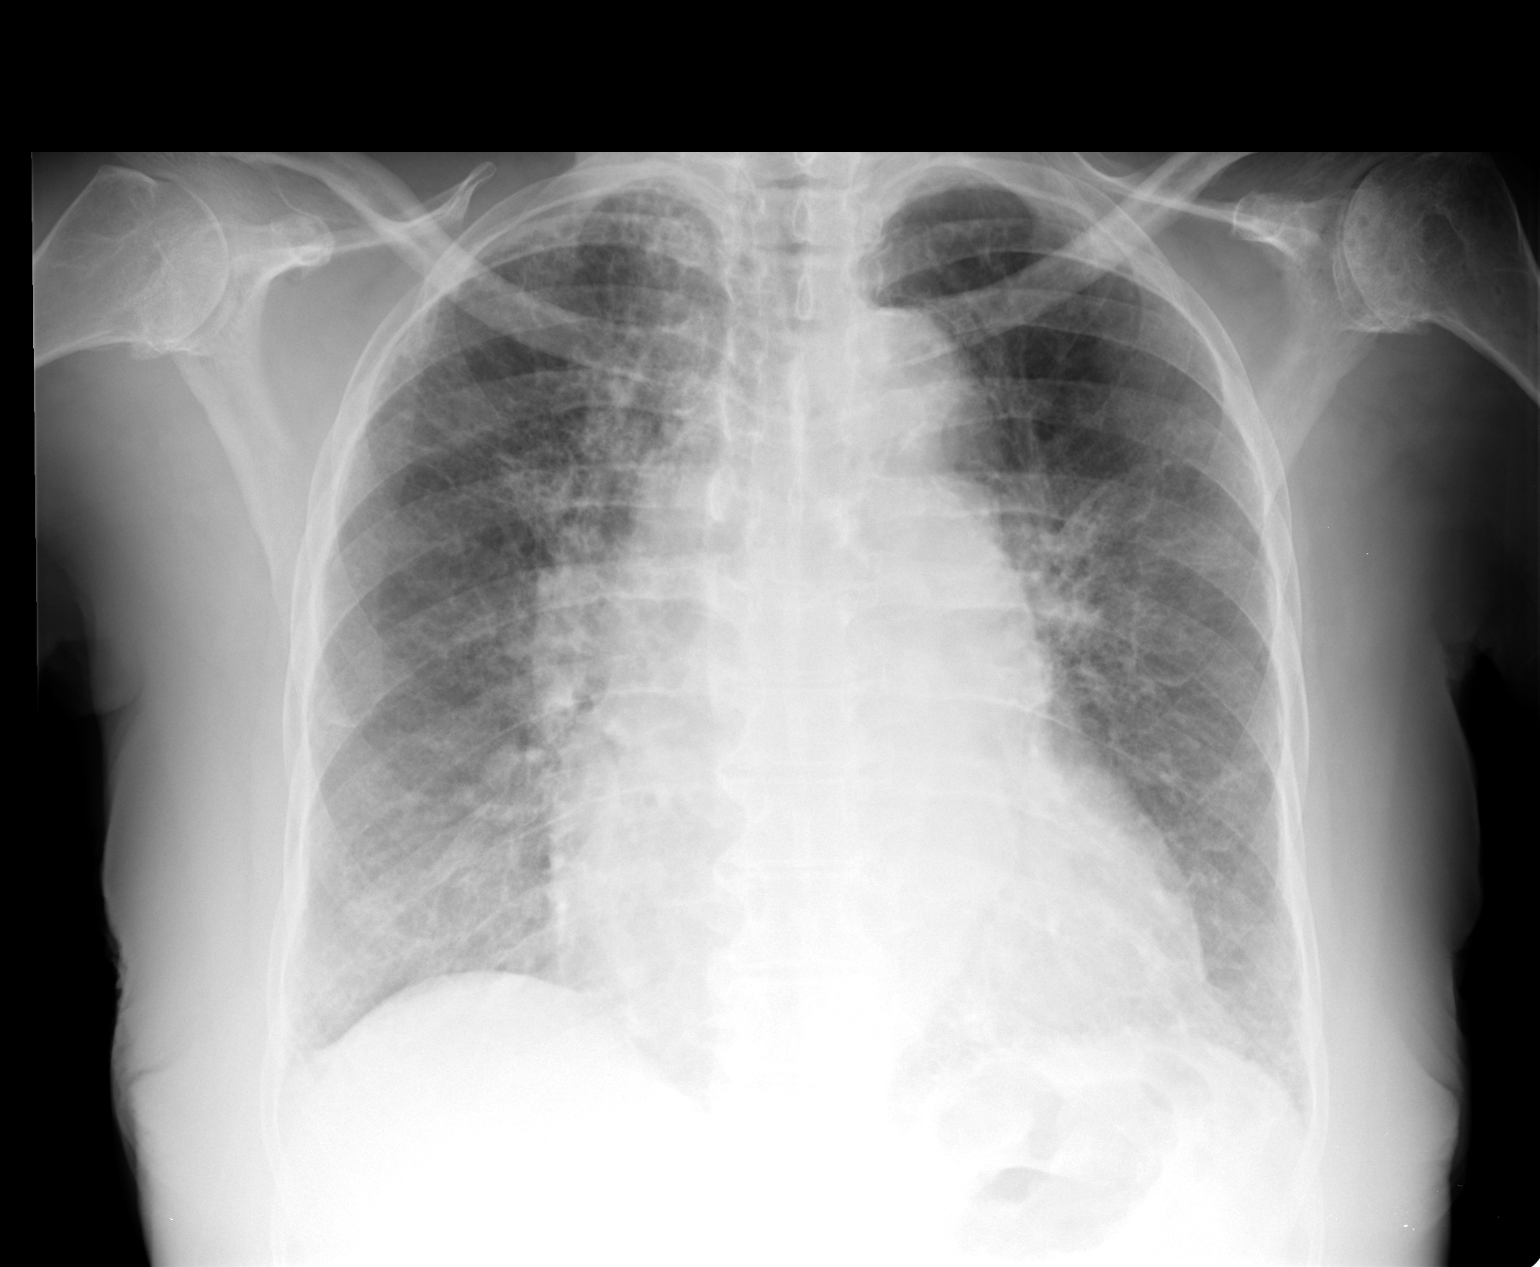

[view not recorded (2 of 2)]
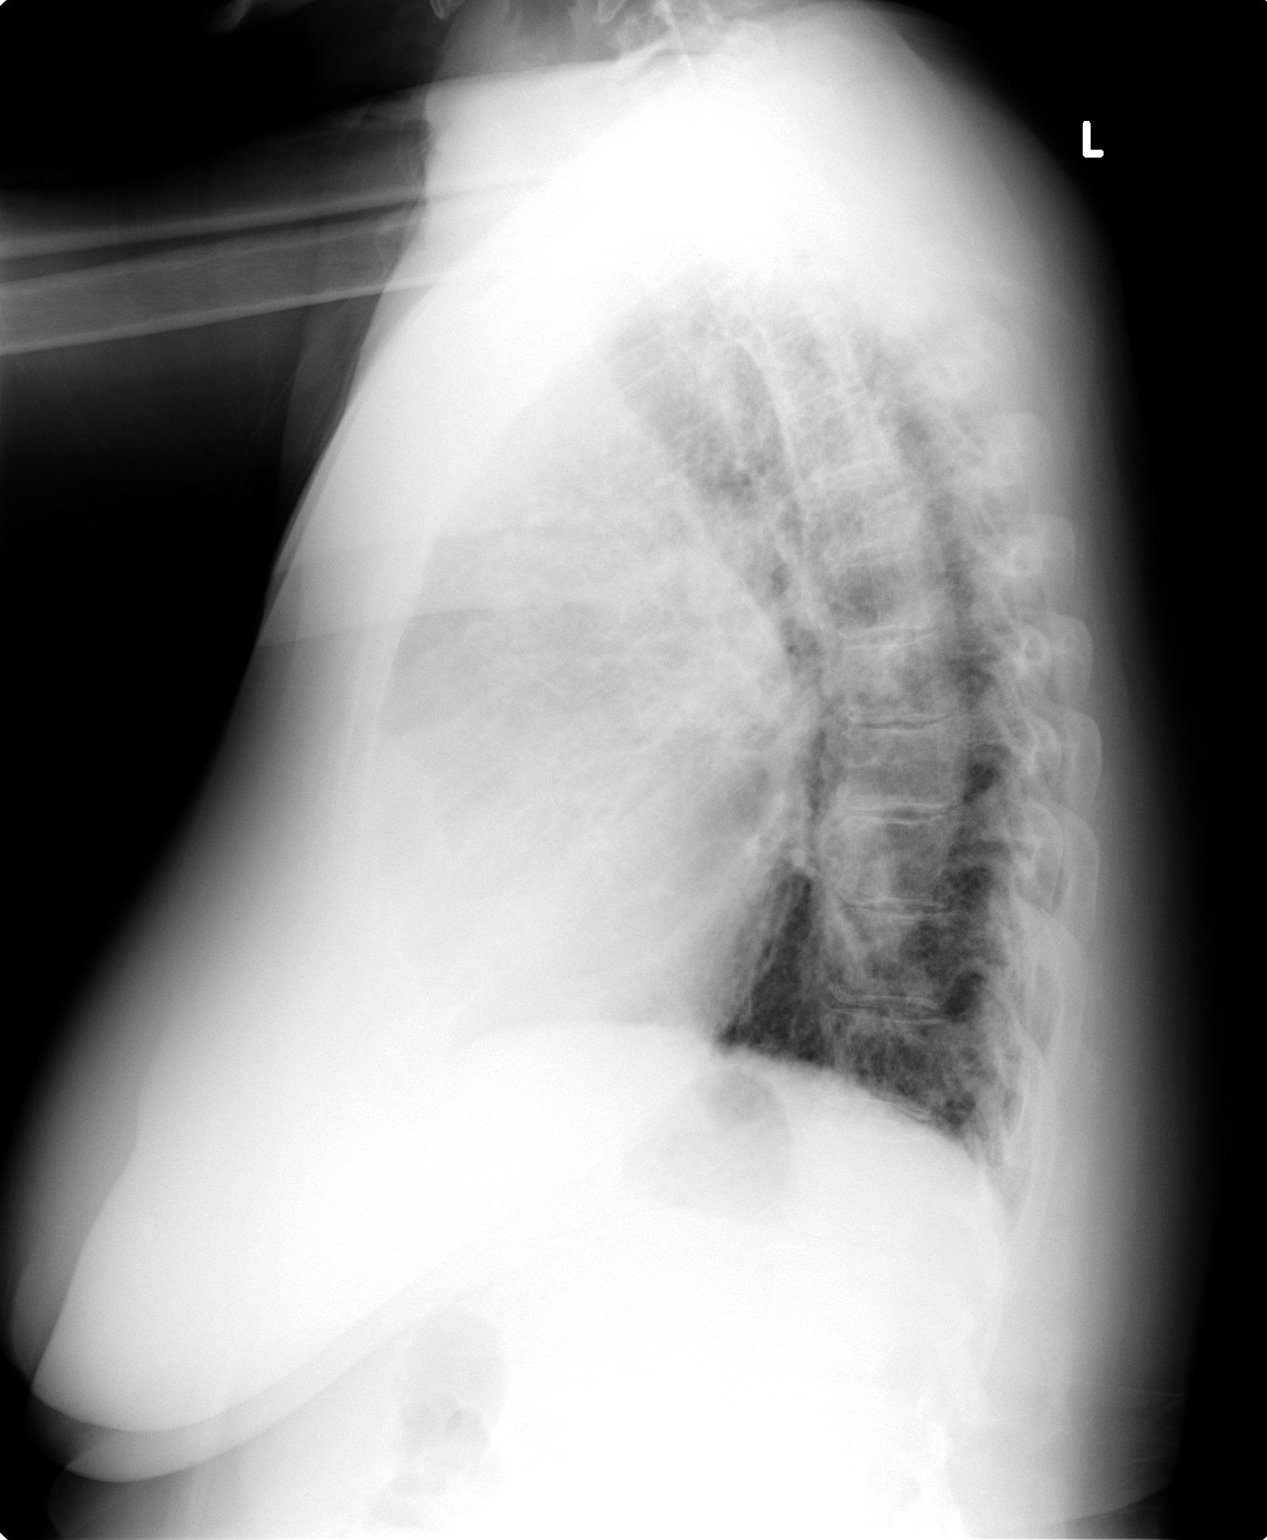

[2 of 2 positions shown; findings below may reference images not displayed]

FINDINGS: Cardiac silhouette is mild to moderately enlarged. The aorta is
uncoiled. No convincing mediastinal or hilar masses or adenopathy.

Bilateral coarse reticular opacities and irregular interstitial
densities are consistent with interstitial fibrosis. Scarring is
most prominent in the right upper lobe. There is no focal
consolidation to suggest pneumonia. Interstitial densities are less
prominent than on the most recent prior study. There is no
convincing pulmonary edema. No pleural effusion or pneumothorax.

There arthropathic changes of both shoulders. The bony thorax is
demineralized but intact.
IMPRESSION: 1. Improved appearance since the prior study, with less prominent
interstitial opacities. This would suggest resolved edema in the
interval.
2. No convincing pneumonia or residual pulmonary edema.
3. Stable cardiomegaly.
4. Stable interstitial lung scarring most evident in the right upper
lobe.

## 2014-04-19 ENCOUNTER — Ambulatory Visit (INDEPENDENT_AMBULATORY_CARE_PROVIDER_SITE_OTHER): Payer: Medicare Other | Admitting: Pulmonary Disease

## 2014-04-19 ENCOUNTER — Encounter: Payer: Self-pay | Admitting: Pulmonary Disease

## 2014-04-19 VITALS — BP 142/78 | HR 76 | Wt 192.2 lb

## 2014-04-19 DIAGNOSIS — J9610 Chronic respiratory failure, unspecified whether with hypoxia or hypercapnia: Secondary | ICD-10-CM

## 2014-04-19 DIAGNOSIS — D869 Sarcoidosis, unspecified: Secondary | ICD-10-CM

## 2014-04-19 NOTE — Assessment & Plan Note (Signed)
Increase methotrexate to 4 tabs every week Incresae prednisone to 5mg  daily Zyrtec daily for allergies We will ask [facility] to evaluate for portable concentrator Home care services

## 2014-04-19 NOTE — Patient Instructions (Addendum)
Increase methotrexate to 4 tabs every week Increase prednisone to 5mg  daily Zyrtec daily for allergies We will ask [facility] to evaluate for portable concentrator Home care services

## 2014-04-19 NOTE — Progress Notes (Signed)
   Subjective:    Patient ID: [name], female    DOB: Nov 25, 1936, 78 y.o.   MRN: [id number]  HPI  78 year old with sarcoidosis involving her skin and lungs & hypoxia.  Good response to 10 mg of prednisone , her dyspnea improved, and her skin lesions resolved.  Chest x-ray showed worsening of bilateral parenchymal scarring with chronic fibrotic changes. ACE level was 129.  09/2008 FVC 52 %, TLC 57%, DLCO 32 % >> decreased  Spirometry 10/09 >> FVC decreased to 52% from 63% in 4/09, DLCO 32%. Started methotrexate 5 mg q sunday with LFT monitoring    Admitted February 2015 - acute hypoxic respiratory failure secondary to healthcare associated pneumonia with underlying sarcoidosis. Chest x-ray showed diffuse interstitial lung disease slightly increased in both lungs. Patient did require initial BiPAP support and was transitioned over to nasal cannula. . Patient was treated with aggressive IV antibiotics, and transitioned to Augmentin prior to discharge. She was treated with steroid burst, and discharged on a slow prednisone taper. Prior to admission. Patient was on prednisone 5 mg 3 times weekly.  11/2013 -Adm for syncopal episode with associated chest pain. She was seen by cardiology and underwent a cardiac catheterization, which showed minimum obstructive disease.    04/19/2014  Chief Complaint  Patient presents with  . Follow-up    Breathing is no better. C/o dry cough at night. Occas wheezing/chest tx. uses 2 liters O2 24/7. pt entered exam room 86% 2 liters pulsed. After sitting for few seconds recovered to 91% 2 liters pulsed.    Accompanied by neighbor/CNA who is also providing care She wants smaller portable o2 -larger tank very cumbersome Also requests home care Remains on 5mg  pred tiw & 15 mg methotrexate once/wk No skin lesions   Review of Systems neg for any significant sore throat, dysphagia, itching, sneezing, nasal congestion or excess/ purulent secretions, fever,  chills, sweats, unintended wt loss, pleuritic or exertional cp, hempoptysis, orthopnea pnd or change in chronic leg swelling. Also denies presyncope, palpitations, heartburn, abdominal pain, nausea, vomiting, diarrhea or change in bowel or urinary habits, dysuria,hematuria, rash, arthralgias, visual complaints, headache, numbness weakness or ataxia.     Objective:   Physical Exam  Gen. Pleasant, well-nourished, in no distress ENT - no lesions, no post nasal drip Neck: No JVD, no thyromegaly, no carotid bruits Lungs: no use of accessory muscles, no dullness to percussion, BL 1/2 rales, no rhonchi  Cardiovascular: Rhythm regular, heart sounds  normal, no murmurs or gallops, 1+ peripheral edema Musculoskeletal: No deformities, no cyanosis or clubbing        Assessment & Plan:

## 2014-04-20 ENCOUNTER — Other Ambulatory Visit: Payer: Self-pay | Admitting: Internal Medicine

## 2014-04-21 NOTE — Assessment & Plan Note (Signed)
Ct 2.5 L o2, goal satn 88 & above

## 2014-05-02 ENCOUNTER — Telehealth: Payer: Self-pay | Admitting: Internal Medicine

## 2014-05-02 DIAGNOSIS — R269 Unspecified abnormalities of gait and mobility: Secondary | ICD-10-CM

## 2014-05-02 DIAGNOSIS — M899 Disorder of bone, unspecified: Secondary | ICD-10-CM

## 2014-05-02 DIAGNOSIS — J189 Pneumonia, unspecified organism: Secondary | ICD-10-CM

## 2014-05-02 DIAGNOSIS — J9610 Chronic respiratory failure, unspecified whether with hypoxia or hypercapnia: Secondary | ICD-10-CM

## 2014-05-02 DIAGNOSIS — M949 Disorder of cartilage, unspecified: Secondary | ICD-10-CM

## 2014-05-02 NOTE — Telephone Encounter (Signed)
Ok with me 

## 2014-05-02 NOTE — Telephone Encounter (Signed)
Generated order faxing back to [facility]. Tried calling pt no answer x's 10 rings...Raechel Chute/lmb

## 2014-05-02 NOTE — Telephone Encounter (Signed)
Patient is asking for a wheelchair order to be sent to [facility]. Please advise.

## 2014-05-05 ENCOUNTER — Other Ambulatory Visit: Payer: Self-pay | Admitting: Pulmonary Disease

## 2014-05-05 ENCOUNTER — Other Ambulatory Visit: Payer: Self-pay | Admitting: Internal Medicine

## 2014-05-22 ENCOUNTER — Other Ambulatory Visit: Payer: Self-pay | Admitting: Pulmonary Disease

## 2014-05-30 ENCOUNTER — Other Ambulatory Visit (INDEPENDENT_AMBULATORY_CARE_PROVIDER_SITE_OTHER): Payer: Medicare Other

## 2014-05-30 ENCOUNTER — Encounter: Payer: Self-pay | Admitting: Internal Medicine

## 2014-05-30 ENCOUNTER — Ambulatory Visit (INDEPENDENT_AMBULATORY_CARE_PROVIDER_SITE_OTHER): Payer: Medicare Other | Admitting: Internal Medicine

## 2014-05-30 ENCOUNTER — Ambulatory Visit (INDEPENDENT_AMBULATORY_CARE_PROVIDER_SITE_OTHER)
Admission: RE | Admit: 2014-05-30 | Discharge: 2014-05-30 | Disposition: A | Discharge status: Home / Self Care | Payer: Medicare Other | Source: Ambulatory Visit | Attending: Internal Medicine | Admitting: Internal Medicine

## 2014-05-30 VITALS — BP 112/60 | HR 67 | Temp 98.0°F | Wt 191.1 lb

## 2014-05-30 DIAGNOSIS — J9610 Chronic respiratory failure, unspecified whether with hypoxia or hypercapnia: Secondary | ICD-10-CM

## 2014-05-30 DIAGNOSIS — R0902 Hypoxemia: Secondary | ICD-10-CM

## 2014-05-30 DIAGNOSIS — F411 Generalized anxiety disorder: Secondary | ICD-10-CM

## 2014-05-30 DIAGNOSIS — R7309 Other abnormal glucose: Secondary | ICD-10-CM

## 2014-05-30 LAB — CBC WITH DIFFERENTIAL/PLATELET
BASOS PCT: 0.3 % (ref 0.0–3.0)
Basophils Absolute: 0 10*3/uL (ref 0.0–0.1)
Eosinophils Absolute: 0.1 10*3/uL (ref 0.0–0.7)
Eosinophils Relative: 1.7 % (ref 0.0–5.0)
HCT: 40 % (ref 36.0–46.0)
Hemoglobin: 13 g/dL (ref 12.0–15.0)
Lymphocytes Relative: 19.3 % (ref 12.0–46.0)
Lymphs Abs: 0.8 10*3/uL (ref 0.7–4.0)
MCHC: 32.6 g/dL (ref 30.0–36.0)
MCV: 96.3 fl (ref 78.0–100.0)
MONO ABS: 0.4 10*3/uL (ref 0.1–1.0)
Monocytes Relative: 11.1 % (ref 3.0–12.0)
NEUTROS ABS: 2.7 10*3/uL (ref 1.4–7.7)
NEUTROS PCT: 67.6 % (ref 43.0–77.0)
Platelets: 198 10*3/uL (ref 150.0–400.0)
RBC: 4.15 Mil/uL (ref 3.87–5.11)
RDW: 16.7 % — ABNORMAL HIGH (ref 11.5–15.5)
WBC: 3.9 10*3/uL — ABNORMAL LOW (ref 4.0–10.5)

## 2014-05-30 LAB — BASIC METABOLIC PANEL
BUN: 21 mg/dL (ref 6–23)
CHLORIDE: 96 meq/L (ref 96–112)
CO2: 35 meq/L — AB (ref 19–32)
CREATININE: 1.3 mg/dL — AB (ref 0.4–1.2)
Calcium: 9 mg/dL (ref 8.4–10.5)
GFR: 51.74 mL/min — ABNORMAL LOW (ref >60.00)
Glucose, Bld: 137 mg/dL — ABNORMAL HIGH (ref 70–99)
POTASSIUM: 3.4 meq/L — AB (ref 3.5–5.1)
Sodium: 141 mEq/L (ref 135–145)

## 2014-05-30 LAB — TSH: TSH: 2.01 u[IU]/mL (ref 0.35–4.50)

## 2014-05-30 LAB — HEMOGLOBIN A1C: HEMOGLOBIN A1C: 6.2 % (ref 4.6–6.5)

## 2014-05-30 MED ORDER — FUROSEMIDE 40 MG PO TABS: 40.0000 mg | ORAL_TABLET | Freq: Every day | ORAL | Status: AC

## 2014-05-30 NOTE — Assessment & Plan Note (Signed)
Exacerbated by medical illness and social stressors ( conflict with daughter, moving in with her August 2015) I declined to provide sleeping pills or benzodiazepines until respiratory status stabilizes to avoid unintentional respiratory suppression Discussed role of SSRI, patient will consider but declines to begin same at this time because "I'm not crazy"

## 2014-05-30 NOTE — Assessment & Plan Note (Signed)
Diet controlled, exacerbated by freq pred for pulm dz Does not check cbgs Check a1c q3-765mo to monitor same Lab Results  Component Value Date   HGBA1C 5.9* 01/17/2014

## 2014-05-30 NOTE — Assessment & Plan Note (Signed)
Following with pulm - dx 2004 On MTX and tapering pred -  chronic hypoxic resp failure related to same continue folic acid daily with chronic MTX Last acute exacerbation related to health care acquired pneumonia February 2015 reviewed, now new symptoms in past 30d but refuses to consider ER or return to hospital Labs/CXR today as above, Lasix and will follow up with pulm on same

## 2014-05-30 NOTE — Progress Notes (Signed)
Subjective:    Patient ID: Desiree LikensMargaret A Hayes, female    DOB: Jan 14, 1936, 78 y.o.   MRN: [id number]  HPI  Patient is here for follow up  Reviewed chronic medical issues and interval medical events Concerned about 1 mo low O2 despite [place] O2   Past Medical History  Diagnosis Date  . Sarcoidosis     hypoxic resp failure, follows with pulm for same  . Hypertension   . Allergic rhinitis, cause unspecified   . Esophageal reflux   . Vertigo   . Unspecified vitamin D deficiency   . OSTEOPENIA   . DIABETES MELLITUS, TYPE II, BORDERLINE   . Osteoarthritis     s/p TKR x 2 right  . CAD (coronary artery disease)     a. 11/2013 elevated troponin in setting of presyncope->Cath: LM nl, LAD 3179m, LCX nl, RCA 10-20 diff, EF 55-65%.    Review of Systems  Constitutional: Positive for fatigue. Negative for fever.  Respiratory: Positive for shortness of breath. Negative for cough and wheezing.   Cardiovascular: Positive for leg swelling. Negative for chest pain.       Objective:   Physical Exam  BP 112/60  Pulse 67  Temp(Src) 98 F (36.7 C) (Oral)  Wt 191 lb 1.9 oz (86.691 kg)  SpO2 70% Wt Readings from Last 3 Encounters:  05/30/14 191 lb 1.9 oz (86.691 kg)  04/19/14 192 lb 3.2 oz (87.181 kg)  01/31/14 182 lb (82.555 kg)   Constitutional: She appears chronically ill, but well-developed and well-nourished. No distress. Desiree BenjaminFriend Hayes at side Neck: Normal range of motion. Neck supple. No JVD present. No thyromegaly present.  Cardiovascular: Normal rate, regular rhythm and normal heart sounds.  No murmur heard. 1+ BLE edema to mid calf Pulmonary/Chest: Effort shallow and breath sounds B crackles. She has no wheezes.  Psychiatric: She has a normal mood and affect. Her behavior is normal. Judgment and thought content normal.   Lab Results  Component Value Date   WBC 11.6* 01/31/2014   HGB 14.9 01/31/2014   HCT 47.3* 01/31/2014   PLT 311.0 01/31/2014   GLUCOSE 89 01/31/2014   CHOL 131  08/11/2013   TRIG 65.0 08/11/2013   HDL 42.50 08/11/2013   LDLCALC 76 08/11/2013   ALT 16 01/17/2014   AST 21 01/17/2014   NA 143 01/31/2014   K 4.4 01/31/2014   CL 99 01/31/2014   CREATININE 0.6 01/31/2014   BUN 15 01/31/2014   CO2 37* 01/31/2014   TSH 0.78 08/11/2013   INR 1.16 12/15/2013   HGBA1C 5.9* 01/17/2014    Dg Chest 2 View  01/26/2014   CLINICAL DATA:  Followup pneumonia 01/19/2014. History of sarcoidosis.  EXAM: CHEST  2 VIEW  COMPARISON:  None.  FINDINGS: Cardiac silhouette is mild to moderately enlarged. The aorta is uncoiled. No convincing mediastinal or hilar masses or adenopathy.  Bilateral coarse reticular opacities and irregular interstitial densities are consistent with interstitial fibrosis. Scarring is most prominent in the right upper lobe. There is no focal consolidation to suggest pneumonia. Interstitial densities are less prominent than on the most recent prior study. There is no convincing pulmonary edema. No pleural effusion or pneumothorax.  There arthropathic changes of both shoulders. The bony thorax is demineralized but intact.  IMPRESSION: 1. Improved appearance since the prior study, with less prominent interstitial opacities. This would suggest resolved edema in the interval. 2. No convincing pneumonia or residual pulmonary edema. 3. Stable cardiomegaly. 4. Stable interstitial lung scarring most evident  in the right upper lobe.   Electronically Signed   By: Amie Portlandavid  Ormond M.D.   On: 01/26/2014 13:37       Assessment & Plan:   Problem List Items Addressed This Visit   Anxiety state, unspecified     Exacerbated by medical illness and social stressors ( conflict with daughter, moving in with her August 2015) I declined to provide sleeping pills or benzodiazepines until respiratory status stabilizes to avoid unintentional respiratory suppression Discussed role of SSRI, patient will consider but declines to begin same at this time because "I'm not crazy"    Chronic  respiratory failure     Following with pulm - dx 2004 On MTX and tapering pred -  chronic hypoxic resp failure related to same continue folic acid daily with chronic MTX Last acute exacerbation related to health care acquired pneumonia February 2015 reviewed, now new symptoms in past 30d but refuses to consider ER or return to hospital Labs/CXR today as above, Lasix and will follow up with pulm on same    Relevant Orders      DG Chest 2 View      Basic metabolic panel      CBC with Differential      TSH      Ambulatory referral to Pulmonology   DIABETES MELLITUS, TYPE II, BORDERLINE      Diet controlled, exacerbated by freq pred for pulm dz Does not check cbgs Check a1c q3-4265mo to monitor same Lab Results  Component Value Date   HGBA1C 5.9* 01/17/2014      Relevant Orders      Hemoglobin A1c   Hypoxia - Primary     Acute on chronic disease, reports greater than 30 days of O2 sats above 80% Increasing edema and PND ( unable to sleep supine) Check chest x-ray now Initiate Lasix 40 mg daily x5 days, then as needed Advise followup with pulmonary for her sarcoid issues and hypoxia I declined to provide sleeping pill until respiratory status stabilizes so as to avoid unintentional resp suppression    Relevant Orders      DG Chest 2 View      Basic metabolic panel      CBC with Differential      TSH      Ambulatory referral to Pulmonology

## 2014-05-30 NOTE — Progress Notes (Signed)
Pre visit review using our clinic review tool, if applicable. No additional management support is needed unless otherwise documented below in the visit note. 

## 2014-05-30 NOTE — Patient Instructions (Signed)
It was good to see you today.  We have reviewed your prior records including labs and tests today  Test(s) ordered today. Your results will be released to MyChart (or called to you) after review, usually within 72hours after test completion. If any changes need to be made, you will be notified at that same time.  Medications reviewed and updated Lasix 40 mg daily for next 5 days, then as needed or as directed No other changes recommended at this time.  we'll make referral to see Dr Vassie LollAlva. Our office will contact you regarding appointment(s) once made.  Please schedule followup in 3-4 months, call sooner if problems.

## 2014-05-30 NOTE — Assessment & Plan Note (Signed)
Acute on chronic disease, reports greater than 30 days of O2 sats above 80% Increasing edema and PND ( unable to sleep supine) Check chest x-ray now Initiate Lasix 40 mg daily x5 days, then as needed Advise followup with pulmonary for her sarcoid issues and hypoxia I declined to provide sleeping pill until respiratory status stabilizes so as to avoid unintentional resp suppression

## 2014-05-31 ENCOUNTER — Emergency Department (HOSPITAL_COMMUNITY): Payer: Medicare Other

## 2014-05-31 ENCOUNTER — Encounter (HOSPITAL_COMMUNITY): Admission: EM | Disposition: E | Payer: Self-pay | Source: Home / Self Care | Attending: Cardiovascular Disease

## 2014-05-31 ENCOUNTER — Inpatient Hospital Stay (HOSPITAL_COMMUNITY): Payer: Medicare Other

## 2014-05-31 ENCOUNTER — Inpatient Hospital Stay (HOSPITAL_COMMUNITY)
Admission: EM | Admit: 2014-05-31 | Discharge: 2014-06-15 | DRG: 237 | Disposition: E | Payer: Medicare Other | Attending: Cardiovascular Disease | Admitting: Cardiovascular Disease

## 2014-05-31 ENCOUNTER — Encounter (HOSPITAL_COMMUNITY): Admission: EM | Disposition: E | Payer: Medicare Other | Source: Home / Self Care | Attending: Cardiovascular Disease

## 2014-05-31 ENCOUNTER — Encounter (HOSPITAL_COMMUNITY): Payer: Self-pay | Admitting: Physician Assistant

## 2014-05-31 DIAGNOSIS — I469 Cardiac arrest, cause unspecified: Secondary | ICD-10-CM

## 2014-05-31 DIAGNOSIS — G931 Anoxic brain damage, not elsewhere classified: Secondary | ICD-10-CM | POA: Diagnosis present

## 2014-05-31 DIAGNOSIS — I472 Ventricular tachycardia, unspecified: Secondary | ICD-10-CM | POA: Diagnosis present

## 2014-05-31 DIAGNOSIS — Z833 Family history of diabetes mellitus: Secondary | ICD-10-CM

## 2014-05-31 DIAGNOSIS — R0902 Hypoxemia: Secondary | ICD-10-CM | POA: Diagnosis present

## 2014-05-31 DIAGNOSIS — E119 Type 2 diabetes mellitus without complications: Secondary | ICD-10-CM | POA: Diagnosis present

## 2014-05-31 DIAGNOSIS — Z66 Do not resuscitate: Secondary | ICD-10-CM | POA: Diagnosis not present

## 2014-05-31 DIAGNOSIS — I249 Acute ischemic heart disease, unspecified: Secondary | ICD-10-CM

## 2014-05-31 DIAGNOSIS — I10 Essential (primary) hypertension: Secondary | ICD-10-CM | POA: Diagnosis present

## 2014-05-31 DIAGNOSIS — E559 Vitamin D deficiency, unspecified: Secondary | ICD-10-CM | POA: Diagnosis present

## 2014-05-31 DIAGNOSIS — I200 Unstable angina: Secondary | ICD-10-CM

## 2014-05-31 DIAGNOSIS — IMO0002 Reserved for concepts with insufficient information to code with codable children: Secondary | ICD-10-CM

## 2014-05-31 DIAGNOSIS — E872 Acidosis, unspecified: Secondary | ICD-10-CM | POA: Diagnosis present

## 2014-05-31 DIAGNOSIS — I4729 Other ventricular tachycardia: Secondary | ICD-10-CM | POA: Diagnosis present

## 2014-05-31 DIAGNOSIS — J9600 Acute respiratory failure, unspecified whether with hypoxia or hypercapnia: Secondary | ICD-10-CM | POA: Diagnosis present

## 2014-05-31 DIAGNOSIS — Z96659 Presence of unspecified artificial knee joint: Secondary | ICD-10-CM

## 2014-05-31 DIAGNOSIS — K219 Gastro-esophageal reflux disease without esophagitis: Secondary | ICD-10-CM | POA: Diagnosis present

## 2014-05-31 DIAGNOSIS — W19XXXA Unspecified fall, initial encounter: Secondary | ICD-10-CM

## 2014-05-31 DIAGNOSIS — Z87891 Personal history of nicotine dependence: Secondary | ICD-10-CM

## 2014-05-31 DIAGNOSIS — Z9981 Dependence on supplemental oxygen: Secondary | ICD-10-CM

## 2014-05-31 DIAGNOSIS — I2119 ST elevation (STEMI) myocardial infarction involving other coronary artery of inferior wall: Principal | ICD-10-CM

## 2014-05-31 DIAGNOSIS — Z808 Family history of malignant neoplasm of other organs or systems: Secondary | ICD-10-CM

## 2014-05-31 DIAGNOSIS — I4901 Ventricular fibrillation: Secondary | ICD-10-CM | POA: Diagnosis present

## 2014-05-31 DIAGNOSIS — I2582 Chronic total occlusion of coronary artery: Secondary | ICD-10-CM | POA: Diagnosis present

## 2014-05-31 DIAGNOSIS — R7309 Other abnormal glucose: Secondary | ICD-10-CM | POA: Diagnosis present

## 2014-05-31 DIAGNOSIS — M1990 Unspecified osteoarthritis, unspecified site: Secondary | ICD-10-CM | POA: Diagnosis present

## 2014-05-31 DIAGNOSIS — R570 Cardiogenic shock: Secondary | ICD-10-CM | POA: Diagnosis present

## 2014-05-31 DIAGNOSIS — D869 Sarcoidosis, unspecified: Secondary | ICD-10-CM | POA: Diagnosis present

## 2014-05-31 DIAGNOSIS — M899 Disorder of bone, unspecified: Secondary | ICD-10-CM | POA: Diagnosis present

## 2014-05-31 DIAGNOSIS — N179 Acute kidney failure, unspecified: Secondary | ICD-10-CM | POA: Diagnosis present

## 2014-05-31 DIAGNOSIS — I213 ST elevation (STEMI) myocardial infarction of unspecified site: Secondary | ICD-10-CM

## 2014-05-31 DIAGNOSIS — I2510 Atherosclerotic heart disease of native coronary artery without angina pectoris: Secondary | ICD-10-CM | POA: Diagnosis present

## 2014-05-31 DIAGNOSIS — Z7982 Long term (current) use of aspirin: Secondary | ICD-10-CM

## 2014-05-31 DIAGNOSIS — M949 Disorder of cartilage, unspecified: Secondary | ICD-10-CM

## 2014-05-31 DIAGNOSIS — Z825 Family history of asthma and other chronic lower respiratory diseases: Secondary | ICD-10-CM

## 2014-05-31 DIAGNOSIS — I498 Other specified cardiac arrhythmias: Secondary | ICD-10-CM | POA: Diagnosis present

## 2014-05-31 HISTORY — PX: LEFT HEART CATHETERIZATION WITH CORONARY ANGIOGRAM: SHX5451

## 2014-05-31 LAB — COMPREHENSIVE METABOLIC PANEL
ALT: 49 U/L — AB (ref 0–35)
AST: 76 U/L — AB (ref 0–37)
Albumin: 3.8 g/dL (ref 3.5–5.2)
Alkaline Phosphatase: 78 U/L (ref 39–117)
BILIRUBIN TOTAL: 2.2 mg/dL — AB (ref 0.3–1.2)
BUN: 26 mg/dL — ABNORMAL HIGH (ref 6–23)
CO2: 24 meq/L (ref 19–32)
Calcium: 9.2 mg/dL (ref 8.4–10.5)
Chloride: 96 mEq/L (ref 96–112)
Creatinine, Ser: 1.42 mg/dL — ABNORMAL HIGH (ref 0.50–1.10)
GFR, EST AFRICAN AMERICAN: 41 mL/min — AB (ref >90)
GFR, EST NON AFRICAN AMERICAN: 35 mL/min — AB (ref >90)
GLUCOSE: 103 mg/dL — AB (ref 70–99)
Potassium: 4 mEq/L (ref 3.7–5.3)
SODIUM: 145 meq/L (ref 137–147)
Total Protein: 7 g/dL (ref 6.0–8.3)

## 2014-05-31 LAB — POCT I-STAT 3, ART BLOOD GAS (G3+)
Acid-base deficit: 12 mmol/L — ABNORMAL HIGH (ref 0.0–2.0)
Acid-base deficit: 1 mmol/L (ref 0.0–2.0)
Acid-base deficit: 6 mmol/L — ABNORMAL HIGH (ref 0.0–2.0)
Acid-base deficit: 8 mmol/L — ABNORMAL HIGH (ref 0.0–2.0)
Bicarbonate: 19.1 mEq/L — ABNORMAL LOW (ref 20.0–24.0)
Bicarbonate: 25.5 mEq/L — ABNORMAL HIGH (ref 20.0–24.0)
Bicarbonate: 27.6 mEq/L — ABNORMAL HIGH (ref 20.0–24.0)
Bicarbonate: 22.1 meq/L (ref 20.0–24.0)
O2 SAT: 100 %
O2 Saturation: 100 %
O2 Saturation: 99 %
O2 Saturation: 11 %
PCO2 ART: 65.6 mmHg — AB (ref 35.0–45.0)
PH ART: 7.077 — AB (ref 7.350–7.450)
PH ART: 7.107 — AB (ref 7.350–7.450)
PH ART: 7.232 — AB (ref 7.350–7.450)
PO2 ART: 222 mmHg — AB (ref 80.0–100.0)
TCO2: 21 mmol/L (ref 0–100)
TCO2: 28 mmol/L (ref 0–100)
TCO2: 30 mmol/L (ref 0–100)
TCO2: 25 mmol/L (ref 0–100)
pCO2 arterial: 64.8 mmHg (ref 35.0–45.0)
pCO2 arterial: 80.9 mmHg (ref 35.0–45.0)
pCO2 arterial: 80.5 mmHg (ref 35.0–45.0)
pH, Arterial: 7.046 — CL (ref 7.350–7.450)
pO2, Arterial: 271 mmHg — ABNORMAL HIGH (ref 80.0–100.0)
pO2, Arterial: 241 mmHg — ABNORMAL HIGH (ref 80.0–100.0)
pO2, Arterial: 16 mmHg — CL (ref 80.0–100.0)

## 2014-05-31 LAB — CBC WITH DIFFERENTIAL/PLATELET
BASOS PCT: 1 % (ref 0–1)
Basophils Absolute: 0.1 10*3/uL (ref 0.0–0.1)
EOS PCT: 2 % (ref 0–5)
Eosinophils Absolute: 0.2 10*3/uL (ref 0.0–0.7)
HEMATOCRIT: 44.2 % (ref 36.0–46.0)
HEMOGLOBIN: 13.4 g/dL (ref 12.0–15.0)
Lymphocytes Relative: 38 % (ref 12–46)
Lymphs Abs: 3.2 10*3/uL (ref 0.7–4.0)
MCH: 30.8 pg (ref 26.0–34.0)
MCHC: 30.3 g/dL (ref 30.0–36.0)
MCV: 101.6 fL — AB (ref 78.0–100.0)
MONO ABS: 0.7 10*3/uL (ref 0.1–1.0)
MONOS PCT: 9 % (ref 3–12)
NEUTROS ABS: 4.3 10*3/uL (ref 1.7–7.7)
Neutrophils Relative %: 50 % (ref 43–77)
Platelets: 156 10*3/uL (ref 150–400)
RBC: 4.35 MIL/uL (ref 3.87–5.11)
RDW: 16.1 % — AB (ref 11.5–15.5)
WBC: 8.4 10*3/uL (ref 4.0–10.5)

## 2014-05-31 LAB — I-STAT CG4 LACTIC ACID, ED: Lactic Acid, Venous: 9.68 mmol/L — ABNORMAL HIGH (ref 0.5–2.2)

## 2014-05-31 LAB — I-STAT CHEM 8, ED
BUN: 30 mg/dL — AB (ref 6–23)
CALCIUM ION: 1 mmol/L — AB (ref 1.13–1.30)
CHLORIDE: 96 meq/L (ref 96–112)
Creatinine, Ser: 1.5 mg/dL — ABNORMAL HIGH (ref 0.50–1.10)
GLUCOSE: 102 mg/dL — AB (ref 70–99)
HCT: 51 % — ABNORMAL HIGH (ref 36.0–46.0)
Hemoglobin: 17.3 g/dL — ABNORMAL HIGH (ref 12.0–15.0)
Potassium: 3.6 mEq/L — ABNORMAL LOW (ref 3.7–5.3)
Sodium: 143 mEq/L (ref 137–147)
TCO2: 25 mmol/L (ref 0–100)

## 2014-05-31 LAB — POCT ACTIVATED CLOTTING TIME: ACTIVATED CLOTTING TIME: 475 s

## 2014-05-31 LAB — I-STAT TROPONIN, ED: Troponin i, poc: 0.13 ng/mL (ref 0.00–0.08)

## 2014-05-31 SURGERY — LEFT HEART CATHETERIZATION WITH CORONARY ANGIOGRAM
Anesthesia: LOCAL

## 2014-05-31 MED ORDER — IPRATROPIUM-ALBUTEROL 0.5-2.5 (3) MG/3ML IN SOLN: 3.0000 mL | RESPIRATORY_TRACT | Status: DC | PRN

## 2014-05-31 MED ORDER — SODIUM CHLORIDE 0.9 % IV SOLN
0.2500 mg/kg/h | INTRAVENOUS | Status: DC
  Filled 2014-05-31: qty 250

## 2014-05-31 MED ORDER — PANTOPRAZOLE SODIUM 40 MG PO TBEC: 40.0000 mg | DELAYED_RELEASE_TABLET | Freq: Every day | ORAL | Status: DC

## 2014-05-31 MED ORDER — SODIUM BICARBONATE 8.4 % IV SOLN
INTRAVENOUS | Status: AC
  Filled 2014-05-31: qty 100

## 2014-05-31 MED ORDER — SODIUM CHLORIDE 0.9 % IV SOLN: INTRAVENOUS | Status: DC

## 2014-05-31 MED ORDER — ATROPINE SULFATE 0.1 MG/ML IJ SOLN
INTRAMUSCULAR | Status: AC
  Filled 2014-05-31: qty 10

## 2014-05-31 MED ORDER — SODIUM BICARBONATE 8.4 % IV SOLN
INTRAVENOUS | Status: AC
  Filled 2014-05-31: qty 50

## 2014-05-31 MED ORDER — HEPARIN (PORCINE) IN NACL 2-0.9 UNIT/ML-% IJ SOLN
INTRAMUSCULAR | Status: AC
  Filled 2014-05-31: qty 1000

## 2014-05-31 MED ORDER — ENOXAPARIN SODIUM 40 MG/0.4ML ~~LOC~~ SOLN
40.0000 mg | SUBCUTANEOUS | Status: DC
  Filled 2014-05-31: qty 0.4

## 2014-05-31 MED ORDER — EPINEPHRINE HCL 0.1 MG/ML IJ SOSY
PREFILLED_SYRINGE | INTRAMUSCULAR | Status: AC
  Filled 2014-05-31: qty 20

## 2014-05-31 MED ORDER — BIVALIRUDIN 250 MG IV SOLR
INTRAVENOUS | Status: AC
  Filled 2014-05-31: qty 250

## 2014-05-31 MED ORDER — SODIUM BICARBONATE 8.4 % IV SOLN
100.0000 meq | Freq: Once | INTRAVENOUS | Status: AC
  Administered 2014-05-31: 100 meq via INTRAVENOUS

## 2014-05-31 MED ORDER — PANTOPRAZOLE SODIUM 40 MG IV SOLR: 40.0000 mg | INTRAVENOUS | Status: DC

## 2014-05-31 MED ORDER — VITAMIN D3 25 MCG (1000 UNIT) PO TABS: 2000.0000 [IU] | ORAL_TABLET | Freq: Every day | ORAL | Status: DC

## 2014-05-31 MED ORDER — DEXTROSE 50 % IV SOLN
INTRAVENOUS | Status: AC
  Administered 2014-05-31: 50 mL
  Filled 2014-05-31: qty 50

## 2014-05-31 MED ORDER — EPINEPHRINE HCL 0.1 MG/ML IJ SOSY
PREFILLED_SYRINGE | INTRAMUSCULAR | Status: AC
  Filled 2014-05-31: qty 10

## 2014-05-31 MED ORDER — AMIODARONE HCL IN DEXTROSE 360-4.14 MG/200ML-% IV SOLN
60.0000 mg/h | INTRAVENOUS | Status: DC
  Filled 2014-05-31: qty 200

## 2014-05-31 MED ORDER — NITROGLYCERIN 0.4 MG SL SUBL: 0.4000 mg | SUBLINGUAL_TABLET | SUBLINGUAL | Status: DC | PRN

## 2014-05-31 MED ORDER — LIDOCAINE HCL (PF) 1 % IJ SOLN
INTRAMUSCULAR | Status: AC
  Filled 2014-05-31: qty 30

## 2014-05-31 MED ORDER — NOREPINEPHRINE BITARTRATE 1 MG/ML IV SOLN
2.0000 ug/min | Freq: Once | INTRAVENOUS | Status: AC
  Administered 2014-05-31: 50 ug/min via INTRAVENOUS
  Filled 2014-05-31: qty 4

## 2014-05-31 MED ORDER — INSULIN ASPART 100 UNIT/ML ~~LOC~~ SOLN: 0.0000 [IU] | SUBCUTANEOUS | Status: DC

## 2014-05-31 MED ORDER — AMIODARONE HCL 150 MG/3ML IV SOLN
INTRAVENOUS | Status: AC
  Filled 2014-05-31: qty 3

## 2014-05-31 MED ORDER — CLOPIDOGREL BISULFATE 75 MG PO TABS: 75.0000 mg | ORAL_TABLET | Freq: Every day | ORAL | Status: DC

## 2014-05-31 MED ORDER — SODIUM CHLORIDE 0.9 % IV SOLN
1.7500 mg/kg/h | INTRAVENOUS | Status: DC
  Administered 2014-05-31: 1.75 mg/kg/h via INTRAVENOUS
  Filled 2014-05-31: qty 250

## 2014-05-31 MED ORDER — AMIODARONE HCL 150 MG/3ML IV SOLN
INTRAVENOUS | Status: AC
  Filled 2014-05-31: qty 6

## 2014-05-31 MED ORDER — LOSARTAN POTASSIUM 25 MG PO TABS: 25.0000 mg | ORAL_TABLET | Freq: Every day | ORAL | Status: DC

## 2014-05-31 MED ORDER — AMIODARONE HCL IN DEXTROSE 360-4.14 MG/200ML-% IV SOLN
30.0000 mg/h | INTRAVENOUS | Status: DC
  Filled 2014-05-31: qty 200

## 2014-05-31 MED ORDER — TRAMADOL HCL 50 MG PO TABS: 50.0000 mg | ORAL_TABLET | Freq: Three times a day (TID) | ORAL | Status: DC | PRN

## 2014-05-31 MED ORDER — ASPIRIN EC 81 MG PO TBEC
81.0000 mg | DELAYED_RELEASE_TABLET | Freq: Every day | ORAL | Status: DC
  Filled 2014-05-31: qty 1

## 2014-05-31 MED ORDER — METOPROLOL TARTRATE 12.5 MG HALF TABLET: 12.5000 mg | ORAL_TABLET | Freq: Two times a day (BID) | ORAL | Status: DC

## 2014-05-31 MED ORDER — ASPIRIN EC 81 MG PO TBEC: 81.0000 mg | DELAYED_RELEASE_TABLET | Freq: Every day | ORAL | Status: DC

## 2014-05-31 MED ORDER — ATORVASTATIN CALCIUM 10 MG PO TABS: 10.0000 mg | ORAL_TABLET | Freq: Every day | ORAL | Status: DC

## 2014-05-31 MED ORDER — HYDROCORTISONE NA SUCCINATE PF 100 MG IJ SOLR
50.0000 mg | Freq: Four times a day (QID) | INTRAMUSCULAR | Status: DC
  Filled 2014-05-31 (×2): qty 1

## 2014-05-31 MED ORDER — ACETAMINOPHEN 325 MG PO TABS: 650.0000 mg | ORAL_TABLET | ORAL | Status: DC | PRN

## 2014-05-31 MED ORDER — AMIODARONE LOAD VIA INFUSION
150.0000 mg | Freq: Once | INTRAVENOUS | Status: DC
  Filled 2014-05-31: qty 83.34

## 2014-05-31 MED ORDER — ONDANSETRON HCL 4 MG/2ML IJ SOLN: 4.0000 mg | Freq: Four times a day (QID) | INTRAMUSCULAR | Status: DC | PRN

## 2014-05-31 MED ORDER — DEXTROSE 5 % IV SOLN
2.0000 ug/min | INTRAVENOUS | Status: DC
  Filled 2014-05-31: qty 16

## 2014-05-31 MED ORDER — EPINEPHRINE HCL 0.1 MG/ML IJ SOSY
PREFILLED_SYRINGE | INTRAMUSCULAR | Status: AC | PRN
  Administered 2014-05-31: 1 mg via INTRAVENOUS

## 2014-05-31 MED ORDER — NITROGLYCERIN 0.2 MG/ML ON CALL CATH LAB
INTRAVENOUS | Status: AC
  Filled 2014-05-31: qty 1

## 2014-06-01 LAB — GLUCOSE, CAPILLARY: Glucose-Capillary: 43 mg/dL — CL (ref 70–99)

## 2014-06-01 MED FILL — Sodium Chloride IV Soln 0.9%: INTRAVENOUS | Qty: 50 | Status: AC

## 2014-06-01 MED FILL — Medication: Qty: 1 | Status: AC

## 2014-06-01 NOTE — Progress Notes (Signed)
UR Completed.  Brown, Sarah Jane [phone] 06/01/2014  

## 2014-06-06 ENCOUNTER — Encounter: Payer: Self-pay | Admitting: Cardiovascular Disease

## 2014-06-15 NOTE — Consult Note (Signed)
PULMONARY / CRITICAL CARE MEDICINE   Name: Desiree LikensMargaret A Borror MRN: [id number] DOB: 04/09/1936    ADMISSION DATE:  06/04/2014 CONSULTATION DATE:  06/01/15  REFERRING MD :  Dr Tresa EndoKelly, MD PRIMARY SERVICE: Cards  CHIEF COMPLAINT:  S/p STEMI, arrest, cath, MODS  BRIEF PATIENT DESCRIPTION: 78 yr old AA F sarcoid, home O2, stemi, code ov er 60 min in total, stent rca, MODS  SIGNIFICANT EVENTS / STUDIES:  6/16>>> arrest total 60 min, stent, balloon pump, pacer 6/16 maed pressors  LINES / TUBES: 6/16 balloon pump rt fem>>> 6/16 ETT>>> 6/16 venous cath rt fem>>>  CULTURES:   ANTIBIOTICS:  HISTORY OF PRESENT ILLNESS:  [name] is a 78 y.o. female with a history of nonobstructive CAD by cath in 2014. She has a history of sarcoid associated with hypoxia and is on nasal cannula at home. She saw her primary recently without major findings. She had no complaints of pain or worsened shortness of breath. Had acute lethargy and brady, EMS called. Pt lost pulses with EMS and had 4 minutes of CPR with 1 epi. Rate returned at 170 SVT and cardioverted at 100 joules, NSR noted. Pt then brady again at 50, pulses lost, 2 minutes of CPR given, 1 epi and then SVT noted and cardioverted at 100 joules. Also given 2 milligrams narcan. Pt intubated in ed. Fuond with STEMI, to cath lab. Additional code etensive down time 60 min in total. Pacer in , rca occlusion inferior changes - stent. Balloon pump. Arrived to icu MODS, refrctoctory shock.   PAST MEDICAL HISTORY :  Past Medical History  Diagnosis Date  . Sarcoidosis     hypoxic resp failure, follows with pulm for same  . Hypertension   . Allergic rhinitis, cause unspecified   . Esophageal reflux   . Vertigo   . Unspecified vitamin D deficiency   . OSTEOPENIA   . DIABETES MELLITUS, TYPE II, BORDERLINE   . Osteoarthritis     s/p TKR x 2 right  . CAD (coronary artery disease)     a. 11/2013 elevated troponin in setting of presyncope->Cath: LM  nl, LAD 4956m, LCX nl, RCA 10-20 diff, EF 55-65%.   Past Surgical History  Procedure Laterality Date  . Appendectomy    . Knee arthroscopy    . Total knee arthroplasty Right     x2   Prior to Admission medications   Medication Sig Start Date End Date Taking? Authorizing Truth [place]  aspirin EC 81 MG tablet Take 1 tablet (81 mg total) by mouth daily. 12/16/13   Ok Anishristopher R Berge, NP  atorvastatin (LIPITOR) 10 MG tablet Take 1 tablet (10 mg total) by mouth daily at 6 PM. 12/16/13   Ok Anishristopher R Berge, NP  Cholecalciferol (VITAMIN D) 2000 UNITS CAPS Take 1 capsule by mouth daily.      Historical Cambre Matson, MD  folic acid (FOLVITE) 1 MG tablet TAKE 1 TABLET BY MOUTH DAILY 04/20/14   Newt LukesValerie A Leschber, MD  furosemide (LASIX) 40 MG tablet Take 1 tablet (40 mg total) by mouth daily. X 5 days, then as needed or as directed 05/30/14   Newt LukesValerie A Leschber, MD  hydrochlorothiazide (MICROZIDE) 12.5 MG capsule Take 12.5 mg by mouth daily as needed (for BP).    Historical Amylee Lodato, MD  ipratropium-albuterol (DUONEB) 0.5-2.5 (3) MG/3ML SOLN Take 3 mLs by nebulization every 4 (four) hours as needed. 01/21/14   Dorothea OgleIskra M Myers, MD  losartan (COZAAR) 25 MG tablet Take 1 tablet (25 mg total)  by mouth daily. 12/07/13   Newt Lukes, MD  Menthol, Topical Analgesic, (BLUE-EMU MAXIMUM STRENGTH) 2.5 % LIQD Apply 1 application topically daily as needed (for k nee pain).    Historical Gerrica Cygan, MD  methotrexate (RHEUMATREX) 2.5 MG tablet TAKE 4 TABLETS BY MOUTH ONCE A WEEK AS DIRECTED    Cyril Mourning V, MD  metoprolol tartrate (LOPRESSOR) 25 MG tablet Take 0.5 tablets (12.5 mg total) by mouth 2 (two) times daily. 12/16/13   Ok Anis, NP  Multiple Vitamins-Minerals (CENTRUM SILVER PO) Take 1 tablet by mouth daily.      Historical Ahmaya Ostermiller, MD  nitroGLYCERIN (NITROSTAT) 0.4 MG SL tablet Place 1 tablet (0.4 mg total) under the tongue every 5 (five) minutes x 3 doses as needed for chest pain. 12/16/13   Ok Anis,  NP  pantoprazole (PROTONIX) 40 MG tablet Take 1 tablet (40 mg total) by mouth daily. 01/21/14   Dorothea Ogle, MD  predniSONE (DELTASONE) 5 MG tablet Take 1 tablet on Mon, Wed, friday 01/04/14   Oretha Milch, MD  traMADol (ULTRAM) 50 MG tablet Take 1 tablet (50 mg total) by mouth every 8 (eight) hours as needed for moderate pain or severe pain. 01/21/14   Dorothea Ogle, MD   No Known Allergies  FAMILY HISTORY:  Family History  Problem Relation Age of Onset  . Asthma Sister   . Bone cancer Mother   . Diabetes type II Sister    SOCIAL HISTORY:  reports that she quit smoking about 28 years ago. Her smoking use included Cigarettes. She has a 16 pack-year smoking history. She has never used smokeless tobacco. She reports that she does not drink alcohol. Her drug history is not on file.  REVIEW OF SYSTEMS: unable   SUBJECTIVE: refrcatory shock, MAP 25 , maed pressors  VITAL SIGNS: Pulse Rate:  [61-120] 61 (06/16 1735) BP: (49-115)/(24-94) 115/94 mmHg (06/16 1656) FiO2 (%):  [50 %-100 %] 50 % (06/16 2046) HEMODYNAMICS:   VENTILATOR SETTINGS: Vent Mode:  [-] PRVC FiO2 (%):  [50 %-100 %] 50 % Set Rate:  [16 bmp-30 bmp] 30 bmp Vt Set:  [470 mL] 470 mL PEEP:  [5 cmH20] 5 cmH20 Plateau Pressure:  [18 cmH20-21 cmH20] 21 cmH20 INTAKE / OUTPUT: Intake/Output   None     PHYSICAL EXAMINATION: General: unresposnove Neuro:  Per fied dil 6 mm, no response any et to pain HEENT:  collar Cardiovascular:   s1 s2 distant rr Lungs:  coarse Abdomen:  Soft, BS wnl, no r/g Musculoskeletal:  Edema 1 plus 2 plus Skin:  No rash  LABS:  CBC  Recent Labs Lab 05/30/14 1158 06/06/2014 1700 05/25/2014 1716  WBC 3.9* 8.4  --   HGB 13.0 13.4 17.3*  HCT 40.0 44.2 51.0*  PLT 198.0 156  --    Coag's No results found for this basename: APTT, INR,  in the last 168 hours BMET  Recent Labs Lab 05/30/14 1158 06/05/2014 1700 06/08/2014 1716  NA 141 145 143  K 3.4* 4.0 3.6*  CL 96 96 96  CO2 35* 24  --    BUN 21 26* 30*  CREATININE 1.3* 1.42* 1.50*  GLUCOSE 137* 103* 102*   Electrolytes  Recent Labs Lab 05/30/14 1158 05/17/2014 1700  CALCIUM 9.0 9.2   Sepsis Markers  Recent Labs Lab 06/08/2014 1716  LATICACIDVEN 9.68*   ABG  Recent Labs Lab 05/24/2014 1807 06/11/2014 1832 05/27/2014 1845  PHART 7.077* 7.107* 7.232*  PCO2ART 64.8* 80.9*  65.6*  PO2ART 271.0* 222.0* 241.0*   Liver Enzymes  Recent Labs Lab Jun 06, 2014 1700  AST 76*  ALT 49*  ALKPHOS 78  BILITOT 2.2*  ALBUMIN 3.8   Cardiac Enzymes No results found for this basename: TROPONINI, PROBNP,  in the last 168 hours Glucose No results found for this basename: GLUCAP,  in the last 168 hours  Imaging Dg Chest 2 View  05/30/2014   CLINICAL DATA:  Hypoxia.  Chronic respiratory failure  EXAM: CHEST  2 VIEW  COMPARISON:  01/26/2014  FINDINGS: Cardiac enlargement, unchanged. Diffuse reticular markings throughout the lungs most prominent in the right upper lobe are stable and consistent with fibrosis. Negative for heart failure or pneumonia. No pleural effusion.  IMPRESSION: Extensive pulmonary fibrosis right greater than left. No acute abnormality.   Electronically Signed   By: Marlan Palau M.D.   On: 05/30/2014 14:03   Ct Head Wo Contrast  June 06, 2014   CLINICAL DATA:  Fall, unwitnessed.  Cardiopulmonary resuscitation.  EXAM: CT HEAD WITHOUT CONTRAST  CT CERVICAL SPINE WITHOUT CONTRAST  TECHNIQUE: Multidetector CT imaging of the head and cervical spine was performed following the standard protocol without intravenous contrast. Multiplanar CT image reconstructions of the cervical spine were also generated.  COMPARISON:  None.  FINDINGS: CT HEAD FINDINGS  The brainstem, cerebellum, cerebral peduncles, thalamus, basal ganglia, basilar cisterns, and ventricular system appear within normal limits. Periventricular white matter and corona radiata hypodensities favor chronic ischemic microvascular white matter disease. The patient is  orally intubated. There is mild chronic right maxillary sinusitis and mild chronic ethmoid sinusitis.  No intracranial hemorrhage, mass lesion, or acute CVA. Degenerative arthropathy of the right temporomandibular joint.  CT CERVICAL SPINE FINDINGS  Small left C7 cervical rib. No cervical spine fracture observed. There is honeycombing in the lung apices, right greater than left. Ectatic aortic arch. Cervical spondylosis at C5-6 and C6-7.  IMPRESSION: 1. No acute cervical spine findings or acute intracranial findings. 2. Mild chronic paranasal sinusitis. 3. Honeycombing in the lung apices, right greater than left. 4. Various ancillary chronic findings as noted above.   Electronically Signed   By: Herbie Baltimore M.D.   On: 06-06-2014 17:51   Ct Cervical Spine Wo Contrast  2014/06/06   CLINICAL DATA:  Fall, unwitnessed.  Cardiopulmonary resuscitation.  EXAM: CT HEAD WITHOUT CONTRAST  CT CERVICAL SPINE WITHOUT CONTRAST  TECHNIQUE: Multidetector CT imaging of the head and cervical spine was performed following the standard protocol without intravenous contrast. Multiplanar CT image reconstructions of the cervical spine were also generated.  COMPARISON:  None.  FINDINGS: CT HEAD FINDINGS  The brainstem, cerebellum, cerebral peduncles, thalamus, basal ganglia, basilar cisterns, and ventricular system appear within normal limits. Periventricular white matter and corona radiata hypodensities favor chronic ischemic microvascular white matter disease. The patient is orally intubated. There is mild chronic right maxillary sinusitis and mild chronic ethmoid sinusitis.  No intracranial hemorrhage, mass lesion, or acute CVA. Degenerative arthropathy of the right temporomandibular joint.  CT CERVICAL SPINE FINDINGS  Small left C7 cervical rib. No cervical spine fracture observed. There is honeycombing in the lung apices, right greater than left. Ectatic aortic arch. Cervical spondylosis at C5-6 and C6-7.  IMPRESSION: 1. No  acute cervical spine findings or acute intracranial findings. 2. Mild chronic paranasal sinusitis. 3. Honeycombing in the lung apices, right greater than left. 4. Various ancillary chronic findings as noted above.   Electronically Signed   By: Herbie Baltimore M.D.   On: 06/06/2014 17:51  CXR: int changes note dprior, ett wnl  ASSESSMENT / PLAN:  PULMONARY A: Acute resp failure, home O2 dep sarcoid, acute resp acidosis P:   abg reviewed, repeat , increase rate Monitor for autopeep All prior chest scans reviewed Keep plat less 30   CARDIOVASCULAR A: STEMI, cardiogenic shock P:  maxed levo, no role vaso balloon pump per cards Pacer per cards at 100 ecg with residual st changes poor prognosis dop reduction as able back to beta affect Stress roids, was on pred  RENAL A:  High risk ATN, ARF P:   Pos balance ok Chem repeat  GASTROINTESTINAL A:  High risk shock liver P:   LFT ppi npo  HEMATOLOGIC A:  DVt risk, STEMI P:  23ba per cards Plavix, asa scd  INFECTIOUS A:  No evidence infection P:   Follow fever  ENDOCRINE A:  Hypoglcyemia, presumed AI P:   Stress roids d5 in fluids, d50  cbg  NEUROLOGIC A:  Severe Anoxic brain injury, don time greater 60 min P:  Avoid sedation RASS goal: 0 Poor prognosis, NOT a candidate hypothermia DNR  TODAY'S SUMMARY: poor prognosis, DNR, continued support  I have personally obtained a history, examined the patient, evaluated laboratory and imaging results, formulated the assessment and plan and placed orders. CRITICAL CARE: The patient is critically ill with multiple organ systems failure and requires high complexity decision making for assessment and support, frequent evaluation and titration of therapies, application of advanced monitoring technologies and extensive interpretation of multiple databases. Critical Care Time devoted to patient care services described in this note is 40 minutes.   I have had extensive  discussions with family duaghter. We discussed patients current circumstances and organ failures. We also discussed patient's prior wishes under circumstances such as this also described to Dr Vassie LollAlva, same. Family has decided to NOT perform resuscitation if arrest but to continue current medical support for now. They are thinking about dc all support for comfort   Mcarthur RossettiDaniel J. Tyson AliasFeinstein, MD, FACP Pgr: [phone]  Pulmonary & Critical Care  Pulmonary and Critical Care Medicine [facility] Pager: [phone]  February 14, 2014, 8:49 PM

## 2014-06-15 NOTE — ED Notes (Signed)
Pt noted to have ST elevation on the monitor. EDP at the bedside with cardiology.

## 2014-06-15 NOTE — ED Notes (Signed)
i-stat GC4 result given to Dr. Rubin PayorPickering

## 2014-06-15 NOTE — ED Notes (Signed)
Pt was transported from CT to Cathlab

## 2014-06-15 NOTE — ED Notes (Addendum)
Pt coded while on the cath lab table. 1mg  Epi given. Cardiology and EDP at the bedside. CPR done for 2 minutes. Atropine given at 1724. Pulses regained. HR of 42, and BP 127/63. Pt being paced at a rate of 84 with MA of 88.Transported to cath lab with RN, cardiology and RT.

## 2014-06-15 NOTE — ED Notes (Signed)
 1mg  Epi given, pt with faint pulse noted.

## 2014-06-15 NOTE — Progress Notes (Signed)
Chaplain responded to CPR in ED. Escorted daughter and family to consultation room and then to cath lab waiting area. Present during multiple codes and physician updates for emotional support. Available for follow up, please page.   Maurene CapesHillary D Irusta [phone]

## 2014-06-15 NOTE — ED Provider Notes (Signed)
CSN: [id number]     Arrival date & time 05/21/2014  1633 History   First MD Initiated Contact with Patient 05/16/2014 1647     Chief Complaint  Patient presents with  . Cardiac Arrest     (Consider location/radiation/quality/duration/timing/severity/associated sxs/prior Treatment) Patient is a 78 y.o. female presenting with general illness. The history is provided by the EMS personnel. The history is limited by the condition of the patient.  Illness Severity:  Severe Onset quality:  Sudden Timing:  Constant Progression:  Unable to specify Chronicity:  New   78 yo F presents as post arrest.  Family heard loud sound in bedroom. Patient found on ground. Confused. EMS arrived and patient quickly became more confused-->agonal-->bradycardic-->pulseless. Epi x1 with ROSC. Tachycardic with reported SVT to 180's. Cardioverted to sinus rhythm. Subsequently with repeat of same. Responded to another epi x1 with subsequent cardioversion to NSR. Intubated in field.  Not hypoglycemic.   Past Medical History  Diagnosis Date  . Sarcoidosis     hypoxic resp failure, follows with pulm for same  . Hypertension   . Allergic rhinitis, cause unspecified   . Esophageal reflux   . Vertigo   . Unspecified vitamin D deficiency   . OSTEOPENIA   . DIABETES MELLITUS, TYPE II, BORDERLINE   . Osteoarthritis     s/p TKR x 2 right  . CAD (coronary artery disease)     a. 11/2013 elevated troponin in setting of presyncope->Cath: LM nl, LAD 71m, LCX nl, RCA 10-20 diff, EF 55-65%.   Past Surgical History  Procedure Laterality Date  . Appendectomy    . Knee arthroscopy    . Total knee arthroplasty Right     x2   Family History  Problem Relation Age of Onset  . Asthma Sister   . Bone cancer Mother   . Diabetes type II Sister    History  Substance Use Topics  . Smoking status: Former Smoker -- 0.80 packs/day for 20 years    Types: Cigarettes    Quit date: 12/16/1985  . Smokeless tobacco: Never Used  .  Alcohol Use: No   OB History   Grav Para Term Preterm Abortions TAB SAB Ect Mult Living                 Review of Systems  Unable to perform ROS: Intubated      Allergies  Review of patient's allergies indicates no known allergies.  Home Medications   Prior to Admission medications   Medication Sig Start Date End Date Taking? Authorizing Provider  aspirin EC 81 MG tablet Take 1 tablet (81 mg total) by mouth daily. 12/16/13   Ok Anis, NP  atorvastatin (LIPITOR) 10 MG tablet Take 1 tablet (10 mg total) by mouth daily at 6 PM. 12/16/13   Ok Anis, NP  Cholecalciferol (VITAMIN D) 2000 UNITS CAPS Take 1 capsule by mouth daily.      Historical Provider, MD  folic acid (FOLVITE) 1 MG tablet TAKE 1 TABLET BY MOUTH DAILY 04/20/14   Newt Lukes, MD  furosemide (LASIX) 40 MG tablet Take 1 tablet (40 mg total) by mouth daily. X 5 days, then as needed or as directed 05/30/14   Newt Lukes, MD  hydrochlorothiazide (MICROZIDE) 12.5 MG capsule Take 12.5 mg by mouth daily as needed (for BP).    Historical Provider, MD  ipratropium-albuterol (DUONEB) 0.5-2.5 (3) MG/3ML SOLN Take 3 mLs by nebulization every 4 (four) hours as needed. 01/21/14  Dorothea Ogle, MD  losartan (COZAAR) 25 MG tablet Take 1 tablet (25 mg total) by mouth daily. 12/07/13   Newt Lukes, MD  Menthol, Topical Analgesic, (BLUE-EMU MAXIMUM STRENGTH) 2.5 % LIQD Apply 1 application topically daily as needed (for k nee pain).    Historical Provider, MD  methotrexate (RHEUMATREX) 2.5 MG tablet TAKE 4 TABLETS BY MOUTH ONCE A WEEK AS DIRECTED    Cyril Mourning V, MD  metoprolol tartrate (LOPRESSOR) 25 MG tablet Take 0.5 tablets (12.5 mg total) by mouth 2 (two) times daily. 12/16/13   Ok Anis, NP  Multiple Vitamins-Minerals (CENTRUM SILVER PO) Take 1 tablet by mouth daily.      Historical Provider, MD  nitroGLYCERIN (NITROSTAT) 0.4 MG SL tablet Place 1 tablet (0.4 mg total) under the tongue every 5  (five) minutes x 3 doses as needed for chest pain. 12/16/13   Ok Anis, NP  pantoprazole (PROTONIX) 40 MG tablet Take 1 tablet (40 mg total) by mouth daily. 01/21/14   Dorothea Ogle, MD  predniSONE (DELTASONE) 5 MG tablet Take 1 tablet on Mon, Wed, friday 01/04/14   Oretha Milch, MD  traMADol (ULTRAM) 50 MG tablet Take 1 tablet (50 mg total) by mouth every 8 (eight) hours as needed for moderate pain or severe pain. 01/21/14   Dorothea Ogle, MD   BP 115/94  Pulse 105  Ht 5\' 6"  (1.676 m) Physical Exam  Nursing note and vitals reviewed. Constitutional: She appears well-developed and well-nourished.  HENT:  Head: Normocephalic and atraumatic.  Eyes: Conjunctivae are normal. Right eye exhibits no discharge. Left eye exhibits no discharge.  Pupils ~11mm. nonreactive.  Neck: No tracheal deviation present.  Cardiovascular: Normal heart sounds and intact distal pulses.   Pulmonary/Chest: No stridor. She is in respiratory distress (intubated).  Breath sounds b/l  Abdominal: Soft. She exhibits no distension. There is no guarding.  Musculoskeletal: She exhibits no edema and no tenderness.  Neurological: GCS eye subscore is 1. GCS verbal subscore is 1. GCS motor subscore is 1.  Skin: Skin is warm and dry.    ED Course  INTUBATION Date/Time: 06/01/2014 12:22 AM Performed by: Stevie Kern Authorized by: Billee Cashing Consent: The procedure was performed in an emergent situation. Indications: respiratory failure and  airway protection Intubation method: glidescope. Patient status: unconscious Laryngoscope size: glidescope 4. Tube size: 7.5 mm Tube type: cuffed Number of attempts: 1 Cords visualized: yes Post-procedure assessment: chest rise and ETCO2 monitor Breath sounds: equal Cuff inflated: yes ETT to lip (cm): 23. Tube secured with: ETT holder Patient tolerance: Patient tolerated the procedure well with no immediate complications.   (including critical care time) Labs  Review Labs Reviewed  CBC WITH DIFFERENTIAL - Abnormal; Notable for the following:    MCV 101.6 (*)    RDW 16.1 (*)    All other components within normal limits  COMPREHENSIVE METABOLIC PANEL - Abnormal; Notable for the following:    Glucose, Bld 103 (*)    BUN 26 (*)    Creatinine, Ser 1.42 (*)    AST 76 (*)    ALT 49 (*)    Total Bilirubin 2.2 (*)    GFR calc non Af Amer 35 (*)    GFR calc Af Amer 41 (*)    All other components within normal limits  GLUCOSE, CAPILLARY - Abnormal; Notable for the following:    Glucose-Capillary 43 (*)    All other components within normal limits  I-STAT CHEM  8, ED - Abnormal; Notable for the following:    Potassium 3.6 (*)    BUN 30 (*)    Creatinine, Ser 1.50 (*)    Glucose, Bld 102 (*)    Calcium, Ion 1.00 (*)    Hemoglobin 17.3 (*)    HCT 51.0 (*)    All other components within normal limits  I-STAT CG4 LACTIC ACID, ED - Abnormal; Notable for the following:    Lactic Acid, Venous 9.68 (*)    All other components within normal limits  I-STAT TROPOININ, ED - Abnormal; Notable for the following:    Troponin i, poc 0.13 (*)    All other components within normal limits  POCT I-STAT 3, ART BLOOD GAS (G3+) - Abnormal; Notable for the following:    pH, Arterial 7.077 (*)    pCO2 arterial 64.8 (*)    pO2, Arterial 271.0 (*)    Bicarbonate 19.1 (*)    Acid-base deficit 12.0 (*)    All other components within normal limits  POCT I-STAT 3, ART BLOOD GAS (G3+) - Abnormal; Notable for the following:    pH, Arterial 7.107 (*)    pCO2 arterial 80.9 (*)    pO2, Arterial 222.0 (*)    Bicarbonate 25.5 (*)    Acid-base deficit 6.0 (*)    All other components within normal limits  POCT I-STAT 3, ART BLOOD GAS (G3+) - Abnormal; Notable for the following:    pH, Arterial 7.232 (*)    pCO2 arterial 65.6 (*)    pO2, Arterial 241.0 (*)    Bicarbonate 27.6 (*)    All other components within normal limits  POCT I-STAT 3, ART BLOOD GAS (G3+) - Abnormal;  Notable for the following:    pH, Arterial 7.046 (*)    pCO2 arterial 80.5 (*)    pO2, Arterial 16.0 (*)    Acid-base deficit 8.0 (*)    All other components within normal limits  URINE CULTURE  PROTIME-INR  APTT  URINALYSIS, ROUTINE W REFLEX MICROSCOPIC  COMPREHENSIVE METABOLIC PANEL  TROPONIN I  TROPONIN I  TROPONIN I  LIPID PANEL  CBC  BLOOD GAS, ARTERIAL  POCT ACTIVATED CLOTTING TIME  I-STAT ARTERIAL BLOOD GAS, ED    Imaging Review Dg Chest 2 View  05/30/2014   CLINICAL DATA:  Hypoxia.  Chronic respiratory failure  EXAM: CHEST  2 VIEW  COMPARISON:  01/26/2014  FINDINGS: Cardiac enlargement, unchanged. Diffuse reticular markings throughout the lungs most prominent in the right upper lobe are stable and consistent with fibrosis. Negative for heart failure or pneumonia. No pleural effusion.  IMPRESSION: Extensive pulmonary fibrosis right greater than left. No acute abnormality.   Electronically Signed   By: Marlan Palauharles  Clark M.D.   On: 05/30/2014 14:03   Ct Head Wo Contrast  05/20/2014   CLINICAL DATA:  Fall, unwitnessed.  Cardiopulmonary resuscitation.  EXAM: CT HEAD WITHOUT CONTRAST  CT CERVICAL SPINE WITHOUT CONTRAST  TECHNIQUE: Multidetector CT imaging of the head and cervical spine was performed following the standard protocol without intravenous contrast. Multiplanar CT image reconstructions of the cervical spine were also generated.  COMPARISON:  None.  FINDINGS: CT HEAD FINDINGS  The brainstem, cerebellum, cerebral peduncles, thalamus, basal ganglia, basilar cisterns, and ventricular system appear within normal limits. Periventricular white matter and corona radiata hypodensities favor chronic ischemic microvascular white matter disease. The patient is orally intubated. There is mild chronic right maxillary sinusitis and mild chronic ethmoid sinusitis.  No intracranial hemorrhage, mass lesion, or acute CVA.  Degenerative arthropathy of the right temporomandibular joint.  CT CERVICAL  SPINE FINDINGS  Small left C7 cervical rib. No cervical spine fracture observed. There is honeycombing in the lung apices, right greater than left. Ectatic aortic arch. Cervical spondylosis at C5-6 and C6-7.  IMPRESSION: 1. No acute cervical spine findings or acute intracranial findings. 2. Mild chronic paranasal sinusitis. 3. Honeycombing in the lung apices, right greater than left. 4. Various ancillary chronic findings as noted above.   Electronically Signed   By: Herbie BaltimoreWalt  Liebkemann M.D.   On: 05/22/2014 17:51   Ct Cervical Spine Wo Contrast  05/27/2014   CLINICAL DATA:  Fall, unwitnessed.  Cardiopulmonary resuscitation.  EXAM: CT HEAD WITHOUT CONTRAST  CT CERVICAL SPINE WITHOUT CONTRAST  TECHNIQUE: Multidetector CT imaging of the head and cervical spine was performed following the standard protocol without intravenous contrast. Multiplanar CT image reconstructions of the cervical spine were also generated.  COMPARISON:  None.  FINDINGS: CT HEAD FINDINGS  The brainstem, cerebellum, cerebral peduncles, thalamus, basal ganglia, basilar cisterns, and ventricular system appear within normal limits. Periventricular white matter and corona radiata hypodensities favor chronic ischemic microvascular white matter disease. The patient is orally intubated. There is mild chronic right maxillary sinusitis and mild chronic ethmoid sinusitis.  No intracranial hemorrhage, mass lesion, or acute CVA. Degenerative arthropathy of the right temporomandibular joint.  CT CERVICAL SPINE FINDINGS  Small left C7 cervical rib. No cervical spine fracture observed. There is honeycombing in the lung apices, right greater than left. Ectatic aortic arch. Cervical spondylosis at C5-6 and C6-7.  IMPRESSION: 1. No acute cervical spine findings or acute intracranial findings. 2. Mild chronic paranasal sinusitis. 3. Honeycombing in the lung apices, right greater than left. 4. Various ancillary chronic findings as noted above.   Electronically Signed    By: Herbie BaltimoreWalt  Liebkemann M.D.   On: 05/21/2014 17:51     EKG Interpretation None      MDM   Final diagnoses:  Cardiac arrest  STEMI (ST elevation myocardial infarction)  Fall    Post arrest.  Lost pulses during ED course. Given epi and compressions with eventual ROSC.  Became bradycardic with thready pulses that improved after another epi. Started levophed ggt.  Repeat EKG with Inf STEMI. Cardiology consulted and code stemi activated.  Prior to cath lab patient taken to CT scanner to eval for intracranial pathology in setting of fall from bed. CT head negative.  Patient became bradycardic and eventually lost pulses again in CT scanner. Given epi and compressions with ROSC. Bradycardic. Initiated transcutaneous pacing successfully. Taken to cath lab for further mgmt of STEMI.   Upon initial arrival patient with ETT not advanced very far. Breath sounds bilaterally. Evaluated with glidescope. Cuff was supraglottic. Deflated and changed tube over bougie. Afterwards with good breath sounds, visualized through cords with glidescope, and good color change and chest rise.   Labs and imaging reviewed by myself and considered in medical decision making if ordered. Imaging interpreted by radiology.   Discussed case with Dr. Rubin PayorPickering who is in agreement with assessment and plan.     Stevie Kernyan Danelle Curiale, MD 06/01/14 [phone]

## 2014-06-15 NOTE — ED Notes (Signed)
Pt taken to CT with this RN.

## 2014-06-15 NOTE — Code Documentation (Signed)
Pulses regained 

## 2014-06-15 NOTE — ED Provider Notes (Signed)
I saw and evaluated the patient, reviewed the resident's note and I agree with the findings and plan.   EKG Interpretation   Date/Time:  Tuesday May 31 2014 17:00:52 EDT Ventricular Rate:  80 PR Interval:  313 QRS Duration: 122 QT Interval:  353 QTC Calculation: 407 R Axis:   105 Text Interpretation:  Sinus rhythm Ventricular premature complex Prolonged  PR interval Consider left atrial enlargement RBBB and LPFB Inferior  infarct, acute (RCA) inferior STEMI, new since last tracing Baseline  wander in lead(s) V6 Confirmed by Rubin PayorPICKERING  MD, Harrold DonathNATHAN [phone] on  06/01/2014 2:54:39 PM     Patient presents after cardiac arrest at home. On the ground after some heard. Had confusion. He had a normal respirations but bradycardic and pulseless. Return of circulation. He also reported SVT. After arrival the ER the ET tube was found to the supraglottic. It was replaced over a bougie. Did have return of vitals and required epi drip. Head CT done due to fall. Third EKG that was done showed STEMI. Cath lab was delayed due to need for head CT. While in the CT scanner patient became bradycardic and required CPR and pacing. Patient taken to cath lab. Critical care had also been notified about return of vitals after CPR.  CRITICAL CARE Performed by: Billee CashingPICKERING,NATHAN R. Total critical care time: 30 Critical care time was exclusive of separately billable procedures and treating other patients. Critical care was necessary to treat or prevent imminent or life-threatening deterioration. Critical care was time spent personally by me on the following activities: development of treatment plan with patient and/or surrogate as well as nursing, discussions with consultants, evaluation of patient's response to treatment, examination of patient, obtaining history from patient or surrogate, ordering and performing treatments and interventions, ordering and review of laboratory studies, ordering and review of radiographic  studies, pulse oximetry and re-evaluation of patient's condition.   Juliet RudeNathan R. Rubin PayorPickering, MD 06/01/14 1456

## 2014-06-15 NOTE — ED Notes (Signed)
Pt to department via EMS- pt fell at home and was unresponsive on EMS arrival. Reported to be bradycardic in the 50's. Pt lost pulses with EMS and had 4 minutes of CPR with 1 epi. Rate returned at 170 SVT and cardioverted at 100 joules, NSR noted. Pt then brady again at 50, pulses lost 2 minutes of CPR given 1 epi and then SVT noted and cardioverted at 100 joules. Also given 2 narcan. Pt intubated with a 7.0 on arrival.

## 2014-06-15 NOTE — Progress Notes (Signed)
Received pt from cath lab at approximately 2015. Had Dopamine HCl (3,200 mcg/mL) transfusing at 20 mcg/kg/min (32.6 mL/hr) upon arrival. No order found for Dopamine in MAR; CCM and cardiology aware of Dopamine infusion and instructed to continue infusion for now. Understanding the pt's poor prognosis, CCM and cardiology were notified and discussed code status again with family members at approximately 2100. Orders written to change pt's code status to DNR and to continue current medical treatment without escalating care. Family currently visiting pt at bedside. Reinforced the family's understanding of pt's poor prognosis. Family is somber but expectations are realistic. Family has been offered chaplain services and invited to stay with patient at bedside for as long as they wish. Will continue monitoring. Gregor HamsBINGHAM, CAITLIN, RN

## 2014-06-15 NOTE — CV Procedure (Signed)
Desiree LikensMargaret A Hayes is a 78 y.o. female    [id number]  [id number] LOCATION:  FACILITY: [facility]  PHYSICIAN: Lennette Biharihomas A. Kelly, MD, [facility] January 06, 1936   DATE OF PROCEDURE:  2014/11/22    EMERGENT CARDIAC CATHETERIZATION/PERCUTANEOUS CORONARY INTERVENTION     HISTORY:    [name] is a 78 y.o. female   PROCEDURE: Left heart cardiac catheterization with coronary angiography and LV pressure, temporary pacemaker insertion, PEA cardiac arrest with prolonged CPR,  defibrillation,  intra-aortic balloon pump insertion, PTCA of the proximal to mid RCA, PDA and PLA branches.  The patient was brought to the cardiac catheter patient laboratory intubated and externally paced.  At the start of the procedure, she developed PEA, CPR was instituted, and vascular access was obtained, both in the right femoral vein, and femoral artery.  A 5 French transvenous pacemaker was advanced to the RV apex.  Diagnostic catheter was done with 5 [place] JL5 and 6 French right guide catheter.  The proximal RCA was found to be totally occluded with TIMI 0 flow.  Angiomax  bolus plus infusion was administered.  Anisotropy medium wire was able to cross the total occlusion and this resulted in fairly brisk flow.  There did appear to be distal embolization of thrombus to the distal vessels.  PTCA was performed at the proximal to mid site with a 2.5x20 mm emergent balloon.  The patient then developed sustained rapid monomorphic VT and underwent defibrillation.  She was also treated with amiodarone 300 mg bolus.  She was paced at 100 beats per minute.  She was on dopamine for found hypotension and cardiogenic shock and ultimately Levaquin was started.  She developed recurrent PEA and prolonged CPR was performed.  The patient received numerous doses of intravenous epinephrine.  Several arterial blood gases were obtained and based on the reading.  She received 2 initial amps of sodium bicarbonate , and ultimately received several  additional amps to improve her profound acidosis.  During this time, the family was notified of the patient's extremely poor prognosis.  CPR was continued.  Ultimately, once her pH and bicarbonate markedly improved and with continued CPR and with epinephrine, she was able to have return of LV contractility.  Family members were also brought into the room to discuss the situation.  Ultimately, her cardiac rhythm improved and her blood pressure was able to augment to 75 mm with 11 intra-aortic balloon pump counterpulsation.  Review of her right carotid artery did reveal occlusion of the PDA and PLA.  Vessels distally with thrombus.  PTCA was performed to the PDA vessel and also to the continuation RCA into the PLA.  There was discussion concerning the possibility of adding 2 B3 inhibitor therapy to the Angiomax but due to the prolonged CPR.  It was felt there may be significant potential bleeding risk and to be inhibitor therapy was instituted.  Actually, it was decided not to stent the proximal RCA.  Since the vessel was opened without significant lowering in the distal vessels were small caliber.  A pigtail catheter was inserted for measurement of LV pressure.  The patient was continued to be paced at 100 beats per minute.  She ultimately was transported to the coronary care unit with a blood pressure augmenting to 65-70 on 20 mics of dopamine, as well as Levophed.  The family is aware of her critical situation with very high potential for nonsurvival.   HEMODYNAMICS:   Central Aorta: 65/50 augmenting to 75 mm with one to one  IABP  pump counterpulsation   Left Ventricle: 63/36  ANGIOGRAPHY:  Left main: 50% smooth proximal stenosis  LAD: Moderate size vessel with mild 20-30% proximal stenoses.  The vessel gave rise to a bifurcating diagonal vessel.  The apical LAD was not visualized  Left circumflex: Moderate size vessel that gave rise to a bifurcating marginal branch and small AV groove  circumflex  Right coronary artery: Proximal occlusion with TIMI 0 flow  Following PTCA, the site of proximal occlusion and residual narrowing of 0%.  Initially, there was no flow down the PDA vessel and reduced.  Flow down the PLA vessel contributed by probable distal embolization from the proximal thrombus.  Following PTCA of the PDA PDA was small caliber, but extended to the LV apex.  The distal RCA also had evidence for occlusion extending into the PLA branch.  This was dilated and was small caliber vessel.   IMPRESSION:  Recurrent cardiac arrest, secondary to ST segment elevation inferior wall myocardial infarction, requiring initial defibrillation, CPR, and multiple doses of epinephrine for PEA prior to arrival to the catheterization laboratory  50% left main, proximal to mid smooth stenosis, with mild 20% proximal LAD stenoses, and possible apical LAD occlusion, and normal left circumflex coronary artery  Total proximal occlusion of a large, dominant right coronary artery  Successful reperfusion and PTCA of the proximal RCA, PDA, and distal RCA PLA segment.  Cardiogenic shock, requiring IABP counterpulsation  Prolonged CPR and resuscitation efforts during the catheterization procedure  Ventricular tachycardia   The patient was transported to the coronary care unit paced at 100 beats per minute on dopamine and levophed with continued hypotension and intra-aortic balloon pump augmentation to a proximally 70 mmHg on bivalirudin for continued anticoagulation.   Lennette Biharihomas A. Kelly, MD, [facility] 10-01-14 8:30 PM

## 2014-06-15 NOTE — Progress Notes (Addendum)
Pt's cardiac rhythm went into v-fib and arterial blood pressure was lost at approximately 2155. Family was notified and came to the bedside. Death was confirmed by myself and Rocco Paulsara Tomlinson, RN via ausculation. No heart nor breath sounds auscultated with stethoscope. Expressed condolences and provided family with ample time to visit pt at bedside. Offered hand imprint poem to family. Family was provided with 6 hand-prints and blue folder. MD and CDS notified of pt's expiration. Family expressed gratitude for the care provided and left the unit at approximately 2255.

## 2014-06-15 NOTE — ED Notes (Signed)
i-stat Trop. Result given to Dr. Rubin PayorPickering

## 2014-06-15 NOTE — H&P (Signed)
History and Physical   Patient ID: [name] MRN: [id number], DOB/AGE: 76-Jan-1937 76 y.o. Date of Encounter: 06/09/14  Primary Physician: Rene PaciValerie Leschber, MD Primary Cardiologist: Dr. Antoine PocheHochrein  Chief Complaint:  Inferior STEMI  HPI: Desiree LikensMargaret A Hayes is a 78 y.o. female with a history of nonobstructive CAD by cath in 2014. She has a history of sarcoid associated with hypoxia and is on nasal cannula at home. She has support from family and friends.  She saw Dr. Felicity CoyerLeschber yesterday, and her chest x-ray was unchanged. She had labs performed which showed a potassium of 3.4 and a serum creatinine slightly elevated at 1.3, but no other acute abnormalities.  Today, she was in her usual state of health. According to family, she had no complaints of pain or worsened shortness of breath. She went to sit down on the bed and slid off onto the floor. Family members hurt her and came into the room. She was confused but had no complaints of chest pain or shortness of breath. EMS was called. Initially she was reported to be bradycardic in the 50's. Pt lost pulses with EMS and had 4 minutes of CPR with 1 epi. Rate returned at 170 SVT and cardioverted at 100 joules, NSR noted. Pt then brady again at 50, pulses lost, 2 minutes of CPR given, 1 epi and then SVT noted and cardioverted at 100 joules. Also given 2 milligrams narcan. Pt intubated with a 7.0 on arrival to the ER.  In the emergency room, she did not require any shocks but would become bradycardic and then go into asystole requiring CPR. She had a head CT and neck CT because of the fall, and the preliminary reports are that these are negative. Her ECG with initially showed sinus rhythm with bigeminy. She then developed significant ST elevation in the inferior leads and a code STEMI was called. She continued to have intermittent bradycardia and asystole requiring CPR. She received a total of 4 mg of epinephrine and 1 mg of atropine. External  pacing was initiated. She was taken to the Cath Lab for treatment of the STEMI. She remained unresponsive on the ventilator.  Due to the patient's condition, information was obtained from staff, records and family/friends.   Past Medical History  Diagnosis Date  . Sarcoidosis     hypoxic resp failure, follows with pulm for same  . Hypertension   . Allergic rhinitis, cause unspecified   . Esophageal reflux   . Vertigo   . Unspecified vitamin D deficiency   . OSTEOPENIA   . DIABETES MELLITUS, TYPE II, BORDERLINE   . Osteoarthritis     s/p TKR x 2 right  . CAD (coronary artery disease)     a. 11/2013 elevated troponin in setting of presyncope->Cath: LM nl, LAD 4874m, LCX nl, RCA 10-20 diff, EF 55-65%.    Surgical History:  Past Surgical History  Procedure Laterality Date  . Appendectomy    . Knee arthroscopy    . Total knee arthroplasty Right     x2     I have reviewed the patient's current medications. Medication Sig  aspirin EC 81 MG tablet Take 1 tablet (81 mg total) by mouth daily.  atorvastatin (LIPITOR) 10 MG tablet Take 1 tablet (10 mg total) by mouth daily at 6 PM.  Cholecalciferol (VITAMIN D) 2000 UNITS CAPS Take 1 capsule by mouth daily.    folic acid (FOLVITE) 1 MG tablet TAKE 1 TABLET BY MOUTH DAILY  furosemide (  LASIX) 40 MG tablet Take 1 tablet (40 mg total) by mouth daily. X 5 days, then as needed or as directed  hydrochlorothiazide (MICROZIDE) 12.5 MG capsule Take 12.5 mg by mouth daily as needed (for BP).  ipratropium-albuterol (DUONEB) 0.5-2.5 (3) MG/3ML SOLN Take 3 mLs by nebulization every 4 (four) hours as needed.  losartan (COZAAR) 25 MG tablet Take 1 tablet (25 mg total) by mouth daily.  Menthol, Topical Analgesic, (BLUE-EMU MAXIMUM STRENGTH) 2.5 % LIQD Apply 1 application topically daily as needed (for k nee pain).  methotrexate (RHEUMATREX) 2.5 MG tablet TAKE 4 TABLETS BY MOUTH ONCE A WEEK AS DIRECTED  metoprolol tartrate (LOPRESSOR) 25 MG tablet Take 0.5  tablets (12.5 mg total) by mouth 2 (two) times daily.  Multiple Vitamins-Minerals (CENTRUM SILVER PO) Take 1 tablet by mouth daily.    nitroGLYCERIN (NITROSTAT) 0.4 MG SL tablet Place 1 tablet (0.4 mg total) under the tongue every 5 (five) minutes x 3 doses as needed for chest pain.  pantoprazole (PROTONIX) 40 MG tablet Take 1 tablet (40 mg total) by mouth daily.  predniSONE (DELTASONE) 5 MG tablet Take 1 tablet on Mon, Wed, friday  traMADol (ULTRAM) 50 MG tablet Take 1 tablet (50 mg total) by mouth every 8 (eight) hours as needed for moderate pain or severe pain.   Scheduled Meds: . amiodarone  150 mg Intravenous Once  . [MAR HOLD] norepinephrine (LEVOPHED) Adult infusion  2-50 mcg/min Intravenous Once   Continuous Infusions: . amiodarone     Followed by  . [START ON 06/01/2014] amiodarone     PRN Meds:.  Allergies: No Known Allergies  History   Social History  . Marital Status: Married    Spouse Name: N/A    Number of Children: N/A  . Years of Education: N/A   Occupational History  . Retired    Social History Main Topics  . Smoking status: Former Smoker -- 0.80 packs/day for 20 years    Types: Cigarettes    Quit date: 12/16/1985  . Smokeless tobacco: Never Used  . Alcohol Use: No  . Drug Use: Not on file  . Sexual Activity: Not on file   Other Topics Concern  . Not on file   Social History Narrative   Family helps in her care.    Family History  Problem Relation Age of Onset  . Asthma Sister   . Bone cancer Mother   . Diabetes type II Sister    Family Status  Relation Status Death Age  . Mother Deceased   . Father Deceased     Review of Systems:  Unable to obtain.  Physical Exam: Blood pressure 115/94, pulse 105, height 5\' 6"  (1.676 m). General: Well developed, well nourished,female unresponsive on the ventilator Head: Normocephalic, atraumatic, sclera non-icteric, no xanthomas, nares are without discharge. Dentition: Poor Neck: No carotid bruits. JVD  not elevated. No thyromegally Lungs: Good expansion bilaterally. Rales bilaterally  Heart: Regular rate and rhythm with S1 S2.  No S3 or S4.  No murmur, no rubs, or gallops appreciated. Abdomen: Soft, non-tender, non-distended with decreased bowel sounds. No hepatomegaly. No rebound/guarding. No obvious abdominal masses. Msk:  Strength and tone could not be assessed due to patient condition. No joint deformities or effusions, no spine or costo-vertebral angle tenderness. Extremities: No clubbing or cyanosis. 1+ edema.  Distal pedal pulses are decreased in 4 extrem Neuro: Unresponsive on the ventilator Skin: No rashes or lesions noted  Labs:   Lab Results  Component Value Date  WBC 8.4 05/23/2014   HGB 17.3* 05/23/2014   HCT 51.0* 05/23/2014   MCV 101.6* 05/23/2014   PLT 156 05/23/2014     Recent Labs Lab 05/30/14 1158 10-08-2014 1716  NA 141 143  K 3.4* 3.6*  CL 96 96  CO2 35*  --   BUN 21 30*  CREATININE 1.3* 1.50*  CALCIUM 9.0  --   GLUCOSE 137* 102*    Recent Labs  10-08-2014 1714  TROPIPOC 0.13*   Pro B Natriuretic peptide (BNP)  Date/Time Value Ref Range Status  01/17/2014 11:25 PM 13392.0* 0 - 450 pg/mL Final  09/30/2008 11:11 AM 20.0  0.0-100.0 pg/mL Final    Radiology/Studies: Dg Chest 2 View 05/30/2014   CLINICAL DATA:  Hypoxia.  Chronic respiratory failure  EXAM: CHEST  2 VIEW  COMPARISON:  01/26/2014  FINDINGS: Cardiac enlargement, unchanged. Diffuse reticular markings throughout the lungs most prominent in the right upper lobe are stable and consistent with fibrosis. Negative for heart failure or pneumonia. No pleural effusion.  IMPRESSION: Extensive pulmonary fibrosis right greater than left. No acute abnormality.   Electronically Signed   By: Marlan Palauharles  Clark M.D.   On: 05/30/2014 14:03   ECHO: 01/19/2014 - Left ventricle: The cavity size was normal. There was moderate asymmetric hypertrophy of the septum. Systolic function was normal. The estimated ejection  fraction was in the range of 55% to 60%. Wall motion was normal; there were no regional wall motion abnormalities. Doppler parameters are consistent with abnormal left ventricular relaxation (grade 1 diastolic dysfunction). - Ventricular septum: The contour showed diastolic flattening and systolic flattening. - Aortic valve: Mild regurgitation. - Right ventricle: The cavity size was severely dilated. Systolic function was severely reduced. - Right atrium: The atrium was moderately dilated. - Tricuspid valve: Severe regurgitation. - Pulmonary arteries: Systolic pressure was severely increased. PA peak pressure: 82mm Hg (S).  ECG: Sinus rhythm, rate 80, PVCs noted, right bundle branch block and left posterior fascicular block. Acute inferior MI, meets STEMI criteria. Of note, telemetry showed periodic SVT and bradycardia to asystole requiring CPR.   ASSESSMENT AND PLAN:  Principal Problem:   ST elevation myocardial infarction (STEMI) of inferior wall, initial episode of care - the patient is taken to the Cath Lab with further evaluation and treatment depending on the results. At this time, she is critically ill. The critical care medicine team is aware and will see her after the cath. Her daughter was updated regarding the severity of her condition.  Otherwise, will continue home medications as the patient's condition will allow. Active Problems:   Sarcoidosis   DIABETES MELLITUS, TYPE II, BORDERLINE   Hypoxia   Signed, Theodore DemarkRhonda Barrett, PA-C 05/23/2014 6:11 PM Beeper [phone]   Patient seen and examined. Agree with assessment and plan. 78 yo AAF with h/o sarcoid who developed a cardiac arrest today at home. Pt has undergone CPR  And defibrillation by EMS and [facility] required several doses of epinephrine. She developed heart block and ECG changes of an inferior STEMI. Due to concern for possible head trama will fall at home a head CT was done and she required external  pacing for bradycardia or heart block. She is now taken acutely to the cath lab.   Lennette Biharihomas A. Kelly, MD, [facility] 05/23/2014 8:08 PM

## 2014-06-15 DEATH — deceased

## 2014-06-16 NOTE — Discharge Summary (Signed)
DEATH NOTE:   Ms. [name] is Hayes 78 year old female with Hayes history of nonobstructive CAD by remote cath in 2004.  She has Hayes history of sarcoid associated with hypoxia.  She was in her usual state of health until today.  She apparently had suffered Hayes cardiac arrest.  She lost pulses and was found on the floor by family members.  CPR was instituted.  When EMS arrived, she again has lost pulses and required epinephrine.  CPR and ultimately was shocked.  She presented to the emergency room, but would have episodes of significant bradycardia, and we don't requiring CPR.  Because of her fall, she had Hayes head CT and neck CT.  In the CT she again had found bradycardia and asystole and required pacing.  She was brought acutely to the catheterization laboratory.  Upon arrival to the catheterization laboratory.  She was in EMD.  CPR was instituted as the catheterization procedure was started.  Please refer to my comprehensive extensive cardiac catheterization and percutaneous intervention note.  This found to have total occlusion of Hayes large, dominant RCA and this was successfully reperfused with PTCA.  She had recurrent episodes of PEA, jugular tachycardia and ventricular fibrillation.  Prolonged CPR and resuscitative efforts continued during the procedure.  She was also started on multiple pressors , and an intrinsic balloon pump was inserted for cardiogenic shock.  After prolonged resuscitative efforts, she ultimately did successfully resuscitate.  She was transported back to the coronary care unit.  The pacemaker in place, on dopamine and Levophed with continued hypotension and IABP augmenting to 70 mm mercury.  During the prolonged resuscitative efforts, family members were brought into the room and aware of her grave prognosis. That night, while in the ICU despite maximum pressor support, she continued to become progressively hypotensive.  At that point, she had also been evaluated by Dr. Tyson AliasFeinstein in the  critical care team.  Family members decided against further breast resuscitative efforts.  At approximately 2155.  Her rhythm again went into ventricular fibrillation and she lost her true blood pressure.  He was notified and came to the bedside.  No further resuscitative efforts were undertaken as had been discussed with family members.  Death was confirmed by Gregor Hamsaitlin Bingham, RN, and Mallie Dartingara Thomason, RN.  Final diagnosis:  1. Out of hospital Cardiac arrest secondary to inferior ST elevation myocardial infarction with recurrent episodes of PEA, VT/VF,  MI secondary to proximal RCA occlusion, which  was successfully reperfused via PTCA. 2. Cardiogenic shock 3. acute respiratory failure 4. Sarcoidosis 5. Suspected anoxic brain injury secondary to prolonged arrest.   [name],Desiree Ortmann A, MD

## 2014-06-22 ENCOUNTER — Ambulatory Visit: Payer: Medicare Other | Admitting: Interventional Cardiology

## 2014-08-14 IMAGING — CR DG CHEST 2V
2 series · 2 of 2 positions shown · non-contrast
Comparison: 01/26/2014

CLINICAL DATA: Hypoxia.  Chronic respiratory failure

EXAM:
CHEST  2 VIEW

[view not recorded (1 of 2)]
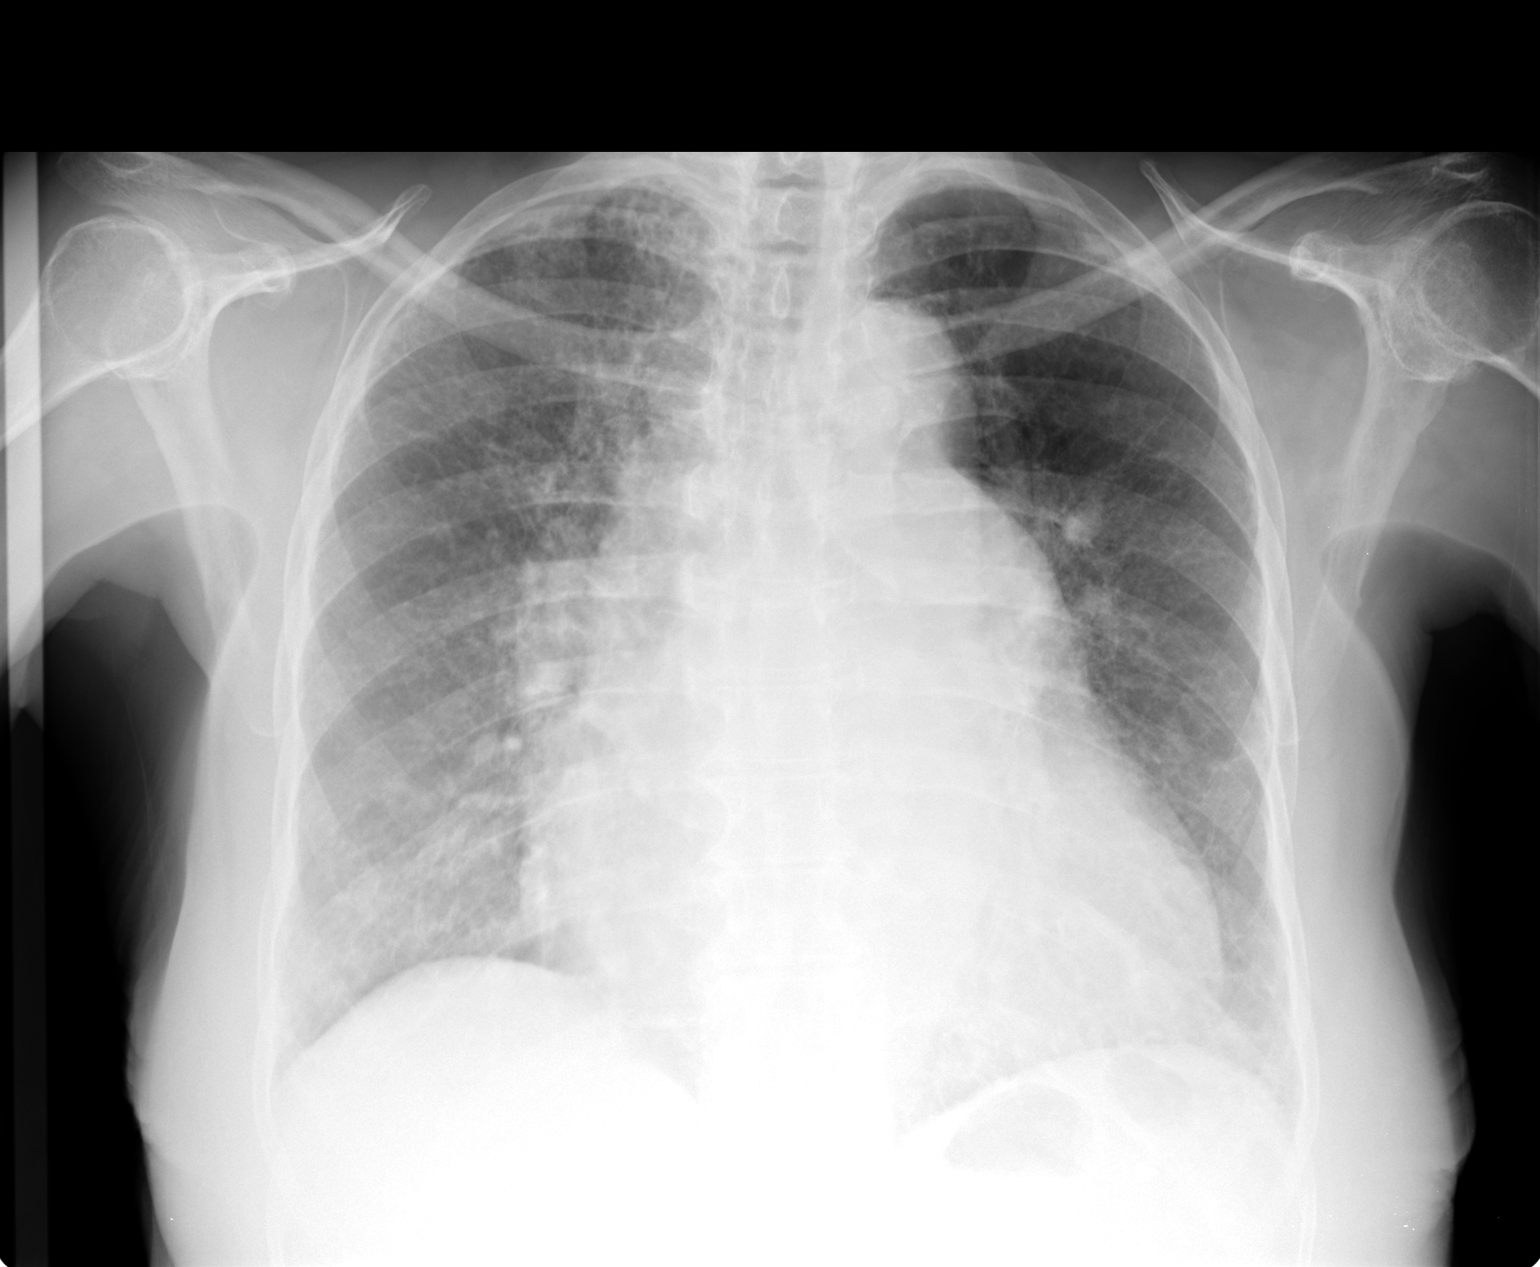

[view not recorded (2 of 2)]
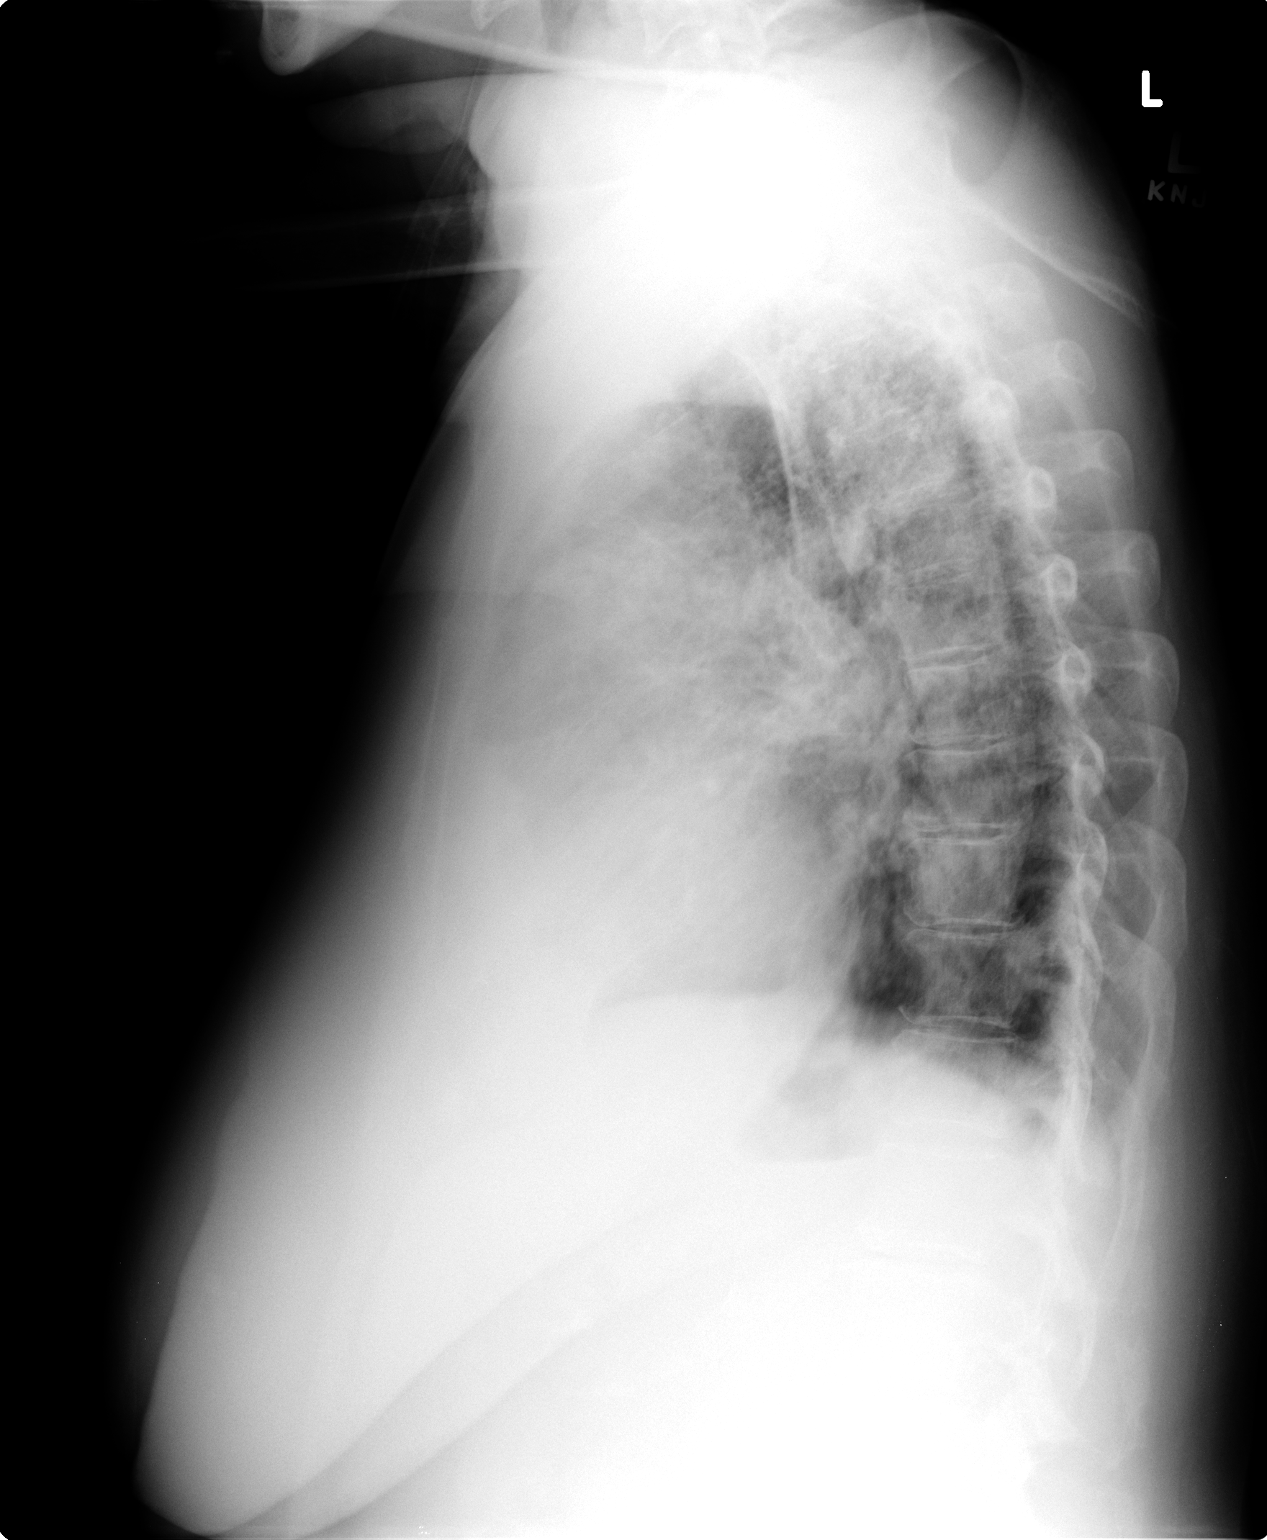

[2 of 2 positions shown; findings below may reference images not displayed]

FINDINGS: Cardiac enlargement, unchanged. Diffuse reticular markings
throughout the lungs most prominent in the right upper lobe are
stable and consistent with fibrosis. Negative for heart failure or
pneumonia. No pleural effusion.
IMPRESSION: Extensive pulmonary fibrosis right greater than left. No acute
abnormality.

## 2014-11-24 ENCOUNTER — Encounter (HOSPITAL_COMMUNITY): Payer: Self-pay | Admitting: Cardiology
